# Patient Record
Sex: Female | Born: 1955 | Race: White | Hispanic: No | Marital: Married | Smoking: Former smoker
Health system: Southern US, Community
[De-identification: ages and names within clinical notes are randomized; demographics above are authoritative.]

## PROBLEM LIST (undated history)

## (undated) DIAGNOSIS — K219 Gastro-esophageal reflux disease without esophagitis: Secondary | ICD-10-CM

## (undated) DIAGNOSIS — F32A Depression, unspecified: Secondary | ICD-10-CM

## (undated) DIAGNOSIS — Z9221 Personal history of antineoplastic chemotherapy: Secondary | ICD-10-CM

## (undated) DIAGNOSIS — C801 Malignant (primary) neoplasm, unspecified: Secondary | ICD-10-CM

## (undated) DIAGNOSIS — Z923 Personal history of irradiation: Secondary | ICD-10-CM

## (undated) HISTORY — DX: Gastro-esophageal reflux disease without esophagitis: K21.9

## (undated) HISTORY — DX: Malignant (primary) neoplasm, unspecified: C80.1

## (undated) HISTORY — DX: Depression, unspecified: F32.A

## (undated) HISTORY — PX: BREAST SURGERY: SHX581

## (undated) HISTORY — PX: OTHER SURGICAL HISTORY: SHX169

## (undated) HISTORY — PX: BREAST LUMPECTOMY: SHX2

---

## 2002-05-11 ENCOUNTER — Other Ambulatory Visit: Admission: RE | Admit: 2002-05-11 | Discharge: 2002-05-11 | Payer: Self-pay | Admitting: Obstetrics and Gynecology

## 2004-02-17 ENCOUNTER — Ambulatory Visit: Payer: Self-pay | Admitting: Oncology

## 2004-02-21 ENCOUNTER — Ambulatory Visit: Payer: Self-pay | Admitting: Oncology

## 2004-03-02 ENCOUNTER — Ambulatory Visit: Payer: Self-pay | Admitting: Oncology

## 2004-07-31 LAB — HM COLONOSCOPY: HM Colonoscopy: NORMAL

## 2004-08-17 ENCOUNTER — Ambulatory Visit: Payer: Self-pay | Admitting: Oncology

## 2004-08-31 ENCOUNTER — Ambulatory Visit: Payer: Self-pay | Admitting: Oncology

## 2005-03-01 ENCOUNTER — Ambulatory Visit: Payer: Self-pay | Admitting: Oncology

## 2005-03-02 ENCOUNTER — Ambulatory Visit: Payer: Self-pay | Admitting: Oncology

## 2005-03-07 ENCOUNTER — Ambulatory Visit: Payer: Self-pay | Admitting: Oncology

## 2005-04-02 ENCOUNTER — Ambulatory Visit: Payer: Self-pay | Admitting: Oncology

## 2005-11-19 ENCOUNTER — Ambulatory Visit: Payer: Self-pay | Admitting: Oncology

## 2006-01-25 ENCOUNTER — Ambulatory Visit: Payer: Self-pay | Admitting: Oncology

## 2006-01-31 ENCOUNTER — Ambulatory Visit: Payer: Self-pay | Admitting: Oncology

## 2006-08-15 ENCOUNTER — Ambulatory Visit: Payer: Self-pay | Admitting: Oncology

## 2006-09-01 ENCOUNTER — Ambulatory Visit: Payer: Self-pay | Admitting: Oncology

## 2006-12-18 ENCOUNTER — Ambulatory Visit: Payer: Self-pay | Admitting: Oncology

## 2007-03-03 ENCOUNTER — Ambulatory Visit: Payer: Self-pay | Admitting: Internal Medicine

## 2007-03-21 ENCOUNTER — Ambulatory Visit: Payer: Self-pay | Admitting: Internal Medicine

## 2007-04-03 ENCOUNTER — Ambulatory Visit: Payer: Self-pay | Admitting: Internal Medicine

## 2008-02-01 ENCOUNTER — Ambulatory Visit: Payer: Self-pay | Admitting: Internal Medicine

## 2008-02-23 ENCOUNTER — Ambulatory Visit: Payer: Self-pay | Admitting: Oncology

## 2008-03-12 ENCOUNTER — Ambulatory Visit: Payer: Self-pay | Admitting: Specialist

## 2008-12-31 ENCOUNTER — Ambulatory Visit: Payer: Self-pay | Admitting: Internal Medicine

## 2009-01-28 ENCOUNTER — Ambulatory Visit: Payer: Self-pay | Admitting: Internal Medicine

## 2009-01-31 ENCOUNTER — Ambulatory Visit: Payer: Self-pay | Admitting: Internal Medicine

## 2010-04-11 ENCOUNTER — Ambulatory Visit: Payer: Self-pay

## 2011-04-17 ENCOUNTER — Ambulatory Visit: Payer: Self-pay | Admitting: Family Medicine

## 2012-07-31 ENCOUNTER — Encounter: Payer: Self-pay | Admitting: Internal Medicine

## 2012-07-31 ENCOUNTER — Ambulatory Visit (INDEPENDENT_AMBULATORY_CARE_PROVIDER_SITE_OTHER): Payer: No Typology Code available for payment source | Admitting: Internal Medicine

## 2012-07-31 VITALS — BP 118/76 | HR 70 | Temp 98.1°F | Resp 16 | Ht 68.0 in | Wt 153.8 lb

## 2012-07-31 DIAGNOSIS — Z720 Tobacco use: Secondary | ICD-10-CM

## 2012-07-31 DIAGNOSIS — Z853 Personal history of malignant neoplasm of breast: Secondary | ICD-10-CM

## 2012-07-31 DIAGNOSIS — R0609 Other forms of dyspnea: Secondary | ICD-10-CM

## 2012-07-31 DIAGNOSIS — E559 Vitamin D deficiency, unspecified: Secondary | ICD-10-CM

## 2012-07-31 DIAGNOSIS — F17200 Nicotine dependence, unspecified, uncomplicated: Secondary | ICD-10-CM

## 2012-07-31 DIAGNOSIS — R0600 Dyspnea, unspecified: Secondary | ICD-10-CM

## 2012-07-31 DIAGNOSIS — E785 Hyperlipidemia, unspecified: Secondary | ICD-10-CM

## 2012-07-31 DIAGNOSIS — R5381 Other malaise: Secondary | ICD-10-CM

## 2012-07-31 DIAGNOSIS — R5383 Other fatigue: Secondary | ICD-10-CM

## 2012-07-31 DIAGNOSIS — Z9889 Other specified postprocedural states: Secondary | ICD-10-CM

## 2012-07-31 LAB — CBC WITH DIFFERENTIAL/PLATELET
Basophils Absolute: 0 10*3/uL (ref 0.0–0.1)
Eosinophils Absolute: 0.1 10*3/uL (ref 0.0–0.7)
Hemoglobin: 14.8 g/dL (ref 12.0–15.0)
Lymphocytes Relative: 32.6 % (ref 12.0–46.0)
MCHC: 34.8 g/dL (ref 30.0–36.0)
Monocytes Relative: 8.3 % (ref 3.0–12.0)
Neutro Abs: 2.9 10*3/uL (ref 1.4–7.7)
Platelets: 214 10*3/uL (ref 150.0–400.0)
RDW: 13.1 % (ref 11.5–14.6)

## 2012-07-31 LAB — COMPREHENSIVE METABOLIC PANEL
Albumin: 4.3 g/dL (ref 3.5–5.2)
CO2: 25 mEq/L (ref 19–32)
Calcium: 9.4 mg/dL (ref 8.4–10.5)
Chloride: 104 mEq/L (ref 96–112)
GFR: 56.72 mL/min — ABNORMAL LOW (ref >60.00)
Glucose, Bld: 81 mg/dL (ref 70–99)
Sodium: 133 mEq/L — ABNORMAL LOW (ref 135–145)
Total Bilirubin: 1.1 mg/dL (ref 0.3–1.2)
Total Protein: 6.9 g/dL (ref 6.0–8.3)

## 2012-07-31 LAB — LDL CHOLESTEROL, DIRECT: Direct LDL: 146.9 mg/dL

## 2012-07-31 LAB — LIPID PANEL
HDL: 58 mg/dL (ref >39.00)
Triglycerides: 70 mg/dL (ref 0.0–149.0)

## 2012-07-31 LAB — TSH: TSH: 1.43 u[IU]/mL (ref 0.35–5.50)

## 2012-07-31 MED ORDER — VARENICLINE TARTRATE 0.5 MG X 11 & 1 MG X 42 PO MISC: ORAL | Status: DC

## 2012-07-31 MED ORDER — VARENICLINE TARTRATE 1 MG PO TABS: 1.0000 mg | ORAL_TABLET | Freq: Two times a day (BID) | ORAL | Status: DC

## 2012-07-31 NOTE — Progress Notes (Signed)
Patient ID: SAMMYE STAFF, female   DOB: [birth year], 57 y.o.   MRN: [id number]  Patient Active Problem List   Diagnosis Date Noted  . History of breast cancer in female 08/02/2012  . History of exploratory thoracotomy 08/02/2012  . Unspecified vitamin D deficiency 08/01/2012  . Tobacco abuse 08/01/2012    Subjective:  CC:   Chief Complaint  Patient presents with  . Establish Care    HPI:   KORRA CHRISTINE is a 57 y.o. 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  Looking forward to it .   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  Treated with chemo, XRT and lumpectomy  At [facility] treated by [facility] for onocology /XRT follow up.  Had Tamoxifen , then aromasin for  Intolerance.  Completed 5 yrs. Last mammogram was Spring 2013 at [facility] but worried they are not checkinn the area of concern. Lately has had a feeling of recurrent periodic fullness in breast.    3) TOBACCO ABUSE. She wants to quit smoking. Chantix has worked in the past. Was abstinent for a few years.  Resumed tobacco 2 years ago.   Running two miles daily but getting short of breath,  Used to do 7 months,      Past Medical History  Diagnosis Date  . Cancer     Past Surgical History  Procedure Laterality Date  . Breast surgery    . Thorecotomy N/A     Family History  Problem Relation Age of Onset  . Cancer Mother 39    colon CA  . Cancer Maternal Grandmother     breast ca  . Stroke Father     multiple PEs,  ca suspected  . Cancer Brother     NHL    History   Social History  . Marital Status: Divorced    Spouse Name: N/A   Number of Children: N/A  . Years of Education: N/A   Occupational History  . Not on file.   Social History Main Topics  . Smoking status: Current Every Day Smoker -- 1.00 packs/day    Types: Cigarettes    Start date: 08/01/2010  . Smokeless tobacco: Never Used     Comment: patient states ready to quit  . Alcohol Use: No     Comment: social rarely  . Drug Use: No  . Sexually Active: Not on file   Other Topics Concern  . Not on file   Social History Narrative  . No narrative on file    No Known Allergies   Review of Systems:   The remainder of the review of systems was negative except those addressed in the HPI.       Objective:  BP 118/76  Pulse 70  Temp(Src) 98.1 F (36.7 C) (Oral)  Resp 16  Ht 5\' 8"  (1.727 m)  Wt 153 lb 12 oz (69.741 kg)  BMI 23.38 kg/m2  SpO2 98%  General appearance: alert, cooperative and appears stated age Ears: normal TM's and external ear canals both ears Throat: lips,  mucosa, and tongue normal; teeth and gums normal Neck: no adenopathy, no carotid bruit, supple, symmetrical, trachea midline and thyroid not enlarged, symmetric, no tenderness/mass/nodules Breast: right lumpectomy , no masses,  Left breast normal. Back: symmetric, no curvature. ROM normal. No CVA tenderness. Lungs: clear to auscultation bilaterally Heart: regular rate and rhythm, S1, S2 normal, no murmur, click, rub or gallop Abdomen: soft, non-tender; bowel sounds normal; no masses,  no organomegaly Pulses: 2+ and symmetric Skin: Skin color, texture, turgor normal. No rashes or lesions Lymph nodes: Cervical, supraclavicular, and axillary nodes normal.  Assessment and Plan:  Unspecified vitamin D deficiency Drisdol weekly x 4, then 1000 units daily   Tobacco abuse Chantix prescribed.   History of breast cancer in female Diagnostic mammogram ordered.  Breast exam was negative for masses.   History of exploratory thoracotomy She has a history of tobacco abuse  and progressive dyspnea.  Chest x ray ordered.   Updated Medication List Outpatient Encounter Prescriptions as of 07/31/2012  Medication Sig Dispense Refill  . ergocalciferol (DRISDOL) 50000 UNITS capsule Take 1 capsule (50,000 Units total) by mouth once a week.  4 capsule  0  . varenicline (CHANTIX CONTINUING MONTH PAK) 1 MG tablet Take 1 tablet (1 mg total) by mouth 2 (two) times daily.  60 tablet  5  . varenicline (CHANTIX STARTING MONTH PAK) 0.5 MG X 11 & 1 MG X 42 tablet Take one 0.5 mg tablet by mouth once daily for 3 days, then increase to one 0.5 mg tablet twice daily for 4 days, then increase to one 1 mg tablet twice daily.  53 tablet  0   No facility-administered encounter medications on file as of 07/31/2012.     Orders Placed This Encounter  Procedures  . HM MAMMOGRAPHY  . MM Digital Diagnostic Bilat  . DG Chest 2 View  . CBC with Differential  . Comprehensive metabolic panel  . TSH  . Lipid panel  . Vitamin D 25 hydroxy  . HM PAP SMEAR  . LDL cholesterol, direct  . HM COLONOSCOPY    No Follow-up on file.

## 2012-07-31 NOTE — Patient Instructions (Addendum)
1200 mg calcium and 800 to 1000 units Vit D  (Unless you are deficient)  Try to get most of calcium via diet.   Chest xraay and diagnostic mammogram both sides at [facility] breast   Sign up for mychart and I will send you your labs via it.   Fasting labs today

## 2012-08-01 DIAGNOSIS — Z720 Tobacco use: Secondary | ICD-10-CM | POA: Insufficient documentation

## 2012-08-01 DIAGNOSIS — E559 Vitamin D deficiency, unspecified: Secondary | ICD-10-CM | POA: Insufficient documentation

## 2012-08-01 LAB — VITAMIN D 25 HYDROXY (VIT D DEFICIENCY, FRACTURES): Vit D, 25-Hydroxy: 25 ng/mL — ABNORMAL LOW (ref 30–89)

## 2012-08-01 MED ORDER — ERGOCALCIFEROL 1.25 MG (50000 UT) PO CAPS: 50000.0000 [IU] | ORAL_CAPSULE | ORAL | Status: DC

## 2012-08-01 NOTE — Assessment & Plan Note (Signed)
Drisdol weekly x 4, then 1000 units daily

## 2012-08-02 ENCOUNTER — Encounter: Payer: Self-pay | Admitting: Internal Medicine

## 2012-08-02 DIAGNOSIS — Z9889 Other specified postprocedural states: Secondary | ICD-10-CM | POA: Insufficient documentation

## 2012-08-02 DIAGNOSIS — Z853 Personal history of malignant neoplasm of breast: Secondary | ICD-10-CM | POA: Insufficient documentation

## 2012-08-02 NOTE — Assessment & Plan Note (Signed)
She has a history of tobacco abuse and progressive dyspnea.  Chest x ray ordered.

## 2012-08-02 NOTE — Assessment & Plan Note (Signed)
Chantix prescribed.

## 2012-08-02 NOTE — Assessment & Plan Note (Signed)
Diagnostic mammogram ordered.  Breast exam was negative for masses.

## 2012-10-15 ENCOUNTER — Encounter: Payer: Self-pay | Admitting: Internal Medicine

## 2013-04-03 ENCOUNTER — Ambulatory Visit (INDEPENDENT_AMBULATORY_CARE_PROVIDER_SITE_OTHER): Payer: No Typology Code available for payment source | Admitting: Adult Health

## 2013-04-03 ENCOUNTER — Encounter: Payer: Self-pay | Admitting: Adult Health

## 2013-04-03 VITALS — BP 110/62 | HR 68 | Temp 98.0°F | Resp 12 | Wt 151.0 lb

## 2013-04-03 DIAGNOSIS — J019 Acute sinusitis, unspecified: Secondary | ICD-10-CM | POA: Insufficient documentation

## 2013-04-03 MED ORDER — AMOXICILLIN-POT CLAVULANATE 875-125 MG PO TABS: 1.0000 | ORAL_TABLET | Freq: Two times a day (BID) | ORAL | Status: DC

## 2013-04-03 MED ORDER — ALBUTEROL SULFATE HFA 108 (90 BASE) MCG/ACT IN AERS: 2.0000 | INHALATION_SPRAY | Freq: Four times a day (QID) | RESPIRATORY_TRACT | Status: DC | PRN

## 2013-04-03 MED ORDER — GUAIFENESIN-CODEINE 100-10 MG/5ML PO SOLN: 5.0000 mL | Freq: Three times a day (TID) | ORAL | Status: DC | PRN

## 2013-04-03 NOTE — Progress Notes (Signed)
Pre visit review using our clinic review tool, if applicable. No additional management support is needed unless otherwise documented below in the visit note. 

## 2013-04-03 NOTE — Patient Instructions (Signed)
  Start Augmentin twice daily x 10 days.  Robitussin AC for severe cough. This will cause sedation. Do not drive when taking this medication.  If no improvement in 4-5 days please call.  ProAir Inhaler 2 puffs into the lungs every 6 hours as needed for wheezing, shortness of breath.

## 2013-04-03 NOTE — Progress Notes (Signed)
   Subjective:    Patient ID: Sandy Hart, female    DOB: March 27, 1956, 58 y.o.   MRN: [id number]  HPI  Rockie presents to clinic with upper respiratory symptoms of congestion, sinus pressure, post nasal drip ongoing x 3 weeks.  Review of Systems  Constitutional: Positive for chills and fatigue. Negative for fever.  HENT: Positive for congestion, postnasal drip, rhinorrhea, sinus pressure and sneezing. Negative for sore throat.   Respiratory: Positive for cough and wheezing.    BP 110/62  Pulse 68  Temp(Src) 98 F (36.7 C) (Oral)  Resp 12  Wt 151 lb (68.493 kg)  SpO2 97%     Objective:   Physical Exam  Constitutional: She is oriented to person, place, and time. She appears well-developed and well-nourished.  Appears acutely ill  HENT:  Pharyngeal erythema. No exudate. Drainage.  Cardiovascular: Normal rate, regular rhythm, normal heart sounds and intact distal pulses.  Exam reveals no gallop and no friction rub.   No murmur heard. Pulmonary/Chest: Effort normal and breath sounds normal. No respiratory distress. She has no wheezes. She has no rales.  Musculoskeletal: Normal range of motion.  Neurological: She is alert and oriented to person, place, and time.          Assessment & Plan:

## 2013-04-03 NOTE — Assessment & Plan Note (Signed)
Start Augmentin x 10 days. Robitussin AC. RTC if no improvement within 4-5 days.

## 2013-10-19 ENCOUNTER — Telehealth: Payer: Self-pay | Admitting: *Deleted

## 2013-10-19 NOTE — Telephone Encounter (Signed)
Pt called states while she was out of town she injured her ankle.  She requests an appointment with Dr Derrel Nip for follow up.  Pt was advised we did not have access to read the radiology disc, however she could be seen on 7.21.15.  Pt information given to Lorriane Shire to schedule 7.21.15 at 2:45pm

## 2013-10-20 ENCOUNTER — Ambulatory Visit (INDEPENDENT_AMBULATORY_CARE_PROVIDER_SITE_OTHER): Payer: No Typology Code available for payment source | Admitting: Internal Medicine

## 2013-10-20 ENCOUNTER — Ambulatory Visit (INDEPENDENT_AMBULATORY_CARE_PROVIDER_SITE_OTHER)
Admission: RE | Admit: 2013-10-20 | Discharge: 2013-10-20 | Disposition: A | Discharge status: Home / Self Care | Payer: No Typology Code available for payment source | Source: Ambulatory Visit | Attending: Internal Medicine | Admitting: Internal Medicine

## 2013-10-20 ENCOUNTER — Encounter: Payer: Self-pay | Admitting: Internal Medicine

## 2013-10-20 VITALS — BP 110/66 | HR 59 | Temp 98.0°F | Resp 16 | Ht 68.0 in | Wt 145.0 lb

## 2013-10-20 DIAGNOSIS — M25579 Pain in unspecified ankle and joints of unspecified foot: Secondary | ICD-10-CM

## 2013-10-20 DIAGNOSIS — M25571 Pain in right ankle and joints of right foot: Secondary | ICD-10-CM

## 2013-10-20 MED ORDER — HYDROCODONE-ACETAMINOPHEN 10-325 MG PO TABS: 1.0000 | ORAL_TABLET | Freq: Three times a day (TID) | ORAL | Status: DC | PRN

## 2013-10-20 MED ORDER — IBUPROFEN 800 MG PO TABS: 800.0000 mg | ORAL_TABLET | Freq: Three times a day (TID) | ORAL | Status: DC | PRN

## 2013-10-20 NOTE — Progress Notes (Signed)
Pre-visit discussion using our clinic review tool. No additional management support is needed unless otherwise documented below in the visit note.  

## 2013-10-20 NOTE — Progress Notes (Signed)
Patient ID: Sandy Hart, female   DOB: Dec 27, 1955, 58 y.o.   MRN: [id number]  Patient Active Problem List   Diagnosis Date Noted  . Pain in joint, ankle and foot 10/21/2013  . Acute sinusitis with symptoms > 10 days 04/03/2013  . History of breast cancer in female 08/02/2012  . History of exploratory thoracotomy 08/02/2012  . Unspecified vitamin D deficiency 08/01/2012  . Tobacco abuse 08/01/2012    Subjective:  CC:   Chief Complaint  Patient presents with  . Ankle Injury    sky diving accident hit ground with right foot.    HPI:   Sandy Hart is a 58 y.o. female who presents for  Right ankle pain and swelling,  Patient sustained an injury last Friday on her first sky diving adventure  Which took place on [place].  Tandem jump, landed on her right foot and fell over.  Felt   iimmediate pain and swelling, which has now localized to the right side. She was evaluated at the [facility] ER  Friday evening and the radiologist felt she had a lateral malleolar fracture but the ER doc disagreed and thought the bones were too obscured by swelling.  She iced it for a few days  And has been using motrin and percocet but left her percocet at the beach .  Using crutches,  Not driving.  Selling is worse by the end of the day    Past Medical History  Diagnosis Date  . Cancer     Past Surgical History  Procedure Laterality Date  . Breast surgery    . Thorecotomy N/A        The following portions of the patient's history were reviewed and updated as appropriate: Allergies, current medications, and problem list.    Review of Systems:   Patient denies headache, fevers, malaise, unintentional weight loss, skin rash, eye pain, sinus congestion and sinus pain, sore throat, dysphagia,  hemoptysis , cough, dyspnea, wheezing, chest pain, palpitations, orthopnea, edema, abdominal pain, nausea, melena, diarrhea, constipation, flank pain, dysuria, hematuria, urinary  Frequency,  nocturia, numbness, tingling, seizures,  Focal weakness, Loss of consciousness,  Tremor, insomnia, depression, anxiety, and suicidal ideation.     History   Social History  . Marital Status: Divorced    Spouse Name: N/A    Number of Children: N/A  . Years of Education: N/A   Occupational History  . Not on file.   Social History Main Topics  . Smoking status: Former Smoker -- 1.00 packs/day    Types: Cigarettes    Start date: 08/01/2010    Quit date: 07/31/2012  . Smokeless tobacco: Never Used     Comment: patient states ready to quit  . Alcohol Use: No     Comment: social rarely  . Drug Use: No  . Sexual Activity: Not on file   Other Topics Concern  . Not on file   Social History Narrative  . No narrative on file    Objective:  Filed Vitals:   10/20/13 1452  BP: 110/66  Pulse: 59  Temp: 98 F (36.7 C)  Resp: 16     General appearance: alert, cooperative and appears stated age Ears: normal TM's and external ear canals both ears Throat: lips, mucosa, and tongue normal; teeth and gums normal Neck: no adenopathy, no carotid bruit, supple, symmetrical, trachea midline and thyroid not enlarged, symmetric, no tenderness/mass/nodules Back: symmetric, no curvature. ROM normal. No CVA tenderness. Lungs: clear to auscultation bilaterally  Heart: regular rate and rhythm, S1, S2 normal, no murmur, click, rub or gallop Abdomen: soft, non-tender; bowel sounds normal; no masses,  no organomegaly Pulses: 2+ and symmetric MSK: right lateral ankle swollen bruised appearing  ROM limited by pain  Lymph nodes: Cervical, supraclavicular, and axillary nodes normal.  Assessment and Plan:  Pain in joint, ankle and foot Secondary to a nondisplaced fibular fracture from traumatic landing during skydiving.  Placed in air cast.  orth referral pending.   Updated Medication List Outpatient Encounter Prescriptions as of 10/20/2013  Medication Sig  . albuterol (PROVENTIL HFA;VENTOLIN  HFA) 108 (90 BASE) MCG/ACT inhaler Inhale 2 puffs into the lungs every 6 (six) hours as needed for wheezing or shortness of breath.  Marland Kitchen amoxicillin-clavulanate (AUGMENTIN) 875-125 MG per tablet Take 1 tablet by mouth 2 (two) times daily.  Marland Kitchen guaiFENesin-codeine 100-10 MG/5ML syrup Take 5 mLs by mouth 3 (three) times daily as needed.  Marland Kitchen HYDROcodone-acetaminophen (NORCO) 10-325 MG per tablet Take 1 tablet by mouth every 8 (eight) hours as needed.  Marland Kitchen ibuprofen (ADVIL,MOTRIN) 800 MG tablet Take 1 tablet (800 mg total) by mouth every 8 (eight) hours as needed.  Marland Kitchen oxyCODONE-acetaminophen (PERCOCET/ROXICET) 5-325 MG per tablet Take 1 tablet by mouth 4 (four) times daily as needed.     Orders Placed This Encounter  Procedures  . DG Ankle Complete Right  . Ambulatory referral to Orthopedic Surgery    No Follow-up on file.

## 2013-10-20 NOTE — Patient Instructions (Addendum)
Continue motrin 800 mg every 8 hours and elevation for the swelling.  vicodin as needed for pain   Continue applying ice for 15 minutes every few hours  Referral to [place] the ankle air cast for stability when walking  And get your x rays tomorrow morning when your leg is least swollenb  You have an appt with [facility] on Monday 10:30 am with  Dr Jhonnie Garner [facility] office

## 2013-10-21 ENCOUNTER — Emergency Department: Payer: Self-pay | Admitting: Internal Medicine

## 2013-10-21 DIAGNOSIS — M25579 Pain in unspecified ankle and joints of unspecified foot: Secondary | ICD-10-CM | POA: Insufficient documentation

## 2013-10-21 NOTE — Assessment & Plan Note (Signed)
Secondary to a nondisplaced fibular fracture from traumatic landing during skydiving.  Placed in air cast.  orth referral pending.

## 2013-10-26 ENCOUNTER — Ambulatory Visit (INDEPENDENT_AMBULATORY_CARE_PROVIDER_SITE_OTHER): Payer: No Typology Code available for payment source | Admitting: Family Medicine

## 2013-10-26 ENCOUNTER — Encounter: Payer: Self-pay | Admitting: Family Medicine

## 2013-10-26 VITALS — BP 110/62 | HR 57 | Temp 98.2°F | Ht 68.0 in

## 2013-10-26 DIAGNOSIS — S82899A Other fracture of unspecified lower leg, initial encounter for closed fracture: Secondary | ICD-10-CM

## 2013-10-26 DIAGNOSIS — S82831A Other fracture of upper and lower end of right fibula, initial encounter for closed fracture: Secondary | ICD-10-CM | POA: Insufficient documentation

## 2013-10-26 NOTE — Progress Notes (Signed)
Sandy Hart [id number] Phone: [phone] Fax: [phone]  Patient ID: Sandy Hart MRN: [id number], DOB: 05/25/1955, 58 y.o. Date of Encounter: 10/26/2013  Primary Physician:  Deborra Medina, MD   Chief Complaint: Sandy Hart   Subjective:   Dear Dr. Derrel Nip,  Thank you for having me see Sandy Hart in consultation today at [facility] at [facility] for her problem with RIGHT distal fibular fracture.  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 She felt some pain immediately and had lateral swelling, as well. There was a question of fibular fracture at the [facility] ER. Since then, she has been non-weightbearing.   DOI 10/16/2013.  10/20/2013, Sandy Hart went to your office and on X-ray was found to have a non-displaced right fibular fracture. She ultimately went to the ER and has been stabilized in a combined fiberglass posterior splint / stirrup and has remained non-weightbearing.   Today, she has diminished pain, some continued bruising, and decreased swelling. Overall, she feels fairly good.  Radiology: Dg Ankle Complete Right  10/20/2013   CLINICAL DATA:  Injured sky diving with lateral ankle pain  EXAM: RIGHT ANKLE - COMPLETE 3+ VIEW  COMPARISON:  None.  FINDINGS: There is a linear lucency through the distal right fibula which may well represent a nondisplaced fracture but no obvious cortical disruption is seen. The ankle joint appears normal and alignment is normal.  IMPRESSION: Suspect nondisplaced linear fracture of the distal right fibula.   Electronically Signed   By: Ivar Drape M.D.   On: 10/20/2013 15:58    The radiological images were independently reviewed by myself in the office and results were reviewed with the patient. My independent interpretation of images:  We reviewed these images face-to-face. On  multiple views there is an area of linear lucency that would correspond to the patient's area of maximal pain which would correspond to a nondisplaced distal fibular fracture. The mortise of the ankle appears to be intact. Electronically Signed  By: Owens Loffler, MD On: 10/26/2013 1:54 PM   Past Medical History  Diagnosis Date  . Cancer     Past Surgical History  Procedure Laterality Date  . Breast surgery    . Thorecotomy N/A     History   Social History  . Marital Status: Married    Spouse Name: N/A    Number of Children: N/A  . Years of Education: N/A   Social History Main Topics  . Smoking status: Former Smoker -- 1.00 packs/day    Types: Cigarettes    Start date: 08/01/2010    Quit date: 07/31/2012  . Smokeless tobacco: Never Used     Comment: patient states ready to quit  . Alcohol Use: No     Comment: social rarely  . Drug Use: No  . Sexual Activity: None   Other Topics Concern  . None   Social History Narrative  . None    Family History  Problem Relation Age of Onset  . Cancer Mother 96    colon CA  . Cancer Maternal Grandmother     breast ca  . Stroke Father     multiple PEs,  ca suspected  . Cancer Brother     NHL    Medications and Allergies reviewed  Review of Systems:    GEN: No  fevers, chills. Nontoxic. Primarily MSK c/o today. MSK: Detailed in the HPI GI: tolerating PO intake without difficulty Neuro: No numbness, parasthesias, or tingling associated. Otherwise, the pertinent positives and negatives are listed above and in the HPI, otherwise a full review of systems has been reviewed and is negative unless noted positive.   Objective:   Physical Examination: BP 110/62  Pulse 57  Temp(Src) 98.2 F (36.8 C) (Oral)  Ht 5\' 8"  (1.727 m)    GEN: WDWN, NAD, Non-toxic, Alert & Oriented x 3 HEENT: Atraumatic, Normocephalic.  Ears and Nose: No external deformity. PSYCH: Normally interactive. Conversant. Not depressed or anxious  appearing.  Calm demeanor.    The patient is nontender throughout all of her toes. She is nontender throughout the entirety of the forefoot. The lateral aspect of the foot and ankle is significantly bruised. There is relatively diffuse swelling throughout the lower extremity about the foot and ankle.  She is nontender in the TALUS She is nontender at the fifth metatarsal. She is nontender at the ATF L., CFL, or deltoid ligaments. She is nontender at the medial malleolus. She is tender at the lateral malleolus and at the distal fibula with minimal palpation. Aggressive palpation is not undertaken given known fracture.  Assessment & Plan:   Fracture of fibula, distal, right, closed   Recommendations: 1. Anticipate at least 6 weeks of immobilization. 6-8 weeks is typical. 2. Placed in a pneumatic Aircast fracture boot. Patient placed in a pneumatic compression-type CAM, which has been shown to decrease overall time of rehab, swelling, and faster return to full function.  3. Partial weight-bearing, advance as tolerated.  4. Out of work until reassessed.  5. Typically, these injuries would be reassessed every 7-10 days to assess stability. Right now, the patient is in perfect anatomical alignment. She is leaving for [place] in 2 days and will be gone for 3 weeks. I discussed potential risk and potential morbidity with her. If her ankle displaces while in [place], she risks the integrity of her ankle and typically with mortise disruption, this injury would be converted to operative management. There is additional risk without getting recommended routine fracture follow-up. The patient understands all of these risks, and I did my best to go over in detail them with her.  We will see the patient back in [place] for f/u Monday, 11/16/2013..   Thank you for having [place] see Sandy Hart in consultation.  This is a very pleasant patient, and I enjoyed meeting her today.   Signed,  Maud Deed. Jules Baty,  MD, [facility]   Discontinued Medications   ALBUTEROL (PROVENTIL HFA;VENTOLIN HFA) 108 (90 BASE) MCG/ACT INHALER    Inhale 2 puffs into the lungs every 6 (six) hours as needed for wheezing or shortness of breath.   AMOXICILLIN-CLAVULANATE (AUGMENTIN) 875-125 MG PER TABLET    Take 1 tablet by mouth 2 (two) times daily.   GUAIFENESIN-CODEINE 100-10 MG/5ML SYRUP    Take 5 mLs by mouth 3 (three) times daily as needed.   OXYCODONE-ACETAMINOPHEN (PERCOCET/ROXICET) 5-325 MG PER TABLET    Take 1 tablet by mouth 4 (four) times daily as needed.   Current Medications at Discharge:   Medication List       This list is accurate as of: 10/26/13  1:41 PM.  Always use your most recent med list.               HYDROcodone-acetaminophen 10-325 MG per tablet  Commonly known as:  NORCO  Take 1 tablet by mouth every 8 (eight) hours as needed.     ibuprofen 800 MG tablet  Commonly known as:  ADVIL,MOTRIN  Take 1 tablet (800 mg total) by mouth every 8 (eight) hours as needed.

## 2013-10-26 NOTE — Progress Notes (Signed)
Pre visit review using our clinic review tool, if applicable. No additional management support is needed unless otherwise documented below in the visit note. 

## 2013-11-16 ENCOUNTER — Encounter: Payer: Self-pay | Admitting: Family Medicine

## 2013-11-16 ENCOUNTER — Ambulatory Visit (INDEPENDENT_AMBULATORY_CARE_PROVIDER_SITE_OTHER)
Admission: RE | Admit: 2013-11-16 | Discharge: 2013-11-16 | Disposition: A | Discharge status: Home / Self Care | Payer: No Typology Code available for payment source | Source: Ambulatory Visit | Attending: Family Medicine | Admitting: Family Medicine

## 2013-11-16 ENCOUNTER — Ambulatory Visit (INDEPENDENT_AMBULATORY_CARE_PROVIDER_SITE_OTHER): Payer: No Typology Code available for payment source | Admitting: Family Medicine

## 2013-11-16 VITALS — BP 116/66 | HR 62 | Temp 98.4°F | Ht 68.0 in | Wt 140.8 lb

## 2013-11-16 DIAGNOSIS — S82899A Other fracture of unspecified lower leg, initial encounter for closed fracture: Secondary | ICD-10-CM

## 2013-11-16 DIAGNOSIS — S82831A Other fracture of upper and lower end of right fibula, initial encounter for closed fracture: Secondary | ICD-10-CM

## 2013-11-16 NOTE — Progress Notes (Signed)
[place] [address] Phone: [phone] Fax: [phone]  Patient ID: ISAAC LACSON MRN: [id number], DOB: 01/17/1956, 58 y.o. Date of Encounter: 11/16/2013  Primary Physician:  Deborra Medina, MD   Chief Complaint: Follow-up   Subjective:   The patient is here in followup for right distal fibular fracture. Intervally, the patient went to [place], she actually did quite well. She was able to wear her fracture boot while she was there. Now she is able to be fully ambulatory and walk without any difficulty. Her pain is much less and she is not really having a superficial swelling.  10/26/2013 Last OV with Owens Loffler, MD  Thank you for having me see [name] in consultation today at [facility] at [facility] for her problem with RIGHT distal fibular fracture.  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 She felt some pain immediately and had lateral swelling, as well. There was a question of fibular fracture at the [facility] ER. Since then, she has been non-weightbearing.   DOI 10/16/2013.  10/20/2013, Ms. Dreese went to your office and on X-ray was found to have a non-displaced right fibular fracture. She ultimately went to the ER and has been stabilized in a combined fiberglass posterior splint / stirrup and has remained non-weightbearing.   Today, she has diminished pain, some continued bruising, and decreased swelling. Overall, she feels fairly good.  The PMH, PSH, Social History, Family History, Medications, and allergies have been reviewed in [facility], and have been updated if relevant.   Review of Systems:    GEN: No fevers, chills. Nontoxic. Primarily MSK c/o today. MSK: Detailed in the HPI GI: tolerating PO intake without difficulty Neuro: No numbness, parasthesias, or tingling associated. Otherwise, the pertinent  positives and negatives are listed above and in the HPI, otherwise a full review of systems has been reviewed and is negative unless noted positive.   Objective:   Physical Examination: BP 116/66  Pulse 62  Temp(Src) 98.4 F (36.9 C) (Oral)  Ht 5\' 8"  (1.727 m)  Wt 140 lb 12 oz (63.844 kg)  BMI 21.41 kg/m2    GEN: WDWN, NAD, Non-toxic, Alert & Oriented x 3 HEENT: Atraumatic, Normocephalic.  Ears and Nose: No external deformity. PSYCH: Normally interactive. Conversant. Not depressed or anxious appearing.  Calm demeanor.    The patient is nontender throughout all of her toes. She is nontender throughout the entirety of the forefoot. Minimal bruising at this point. There is no significant swelling. The patient is minimally tan there on the lateral fibula. Otherwise the foot and ankle exam is completely normal.  Dg Ankle Complete Right  11/16/2013   CLINICAL DATA:  Followup fibular fracture.  EXAM: RIGHT ANKLE - COMPLETE 3+ VIEW  COMPARISON:  10/20/2013  FINDINGS: Fibular fracture is still evident on the oblique view but not evident on the lateral view. This would suggest interval partial healing. There is no fracture displacement or angulation.  No other fracture. Ankle mortise is normally spaced and aligned. There is mild soft tissue swelling, decreased from the prior exam.  IMPRESSION: Fibular fracture is only subtly defined on the oblique view, not evident on the lateral view. There has been partial interval healing. No new abnormalities.   Electronically Signed   By: Lajean Manes M.D.   On: 11/16/2013 12:53  The radiological images were independently reviewed by myself in the office and results were reviewed with the patient. My independent interpretation of images:  I agree that there does appear to be interval fracture healing. The fracture line itself is more vague in appearance. The ankle mortise is intact. This correlates clinically. No dislocation. Electronically Signed  By: Owens Loffler, MD On: 11/16/2013 1:58 PM   Dg Ankle Complete Right  10/20/2013   CLINICAL DATA:  Injured sky diving with lateral ankle pain  EXAM: RIGHT ANKLE - COMPLETE 3+ VIEW  COMPARISON:  None.  FINDINGS: There is a linear lucency through the distal right fibula which may well represent a nondisplaced fracture but no obvious cortical disruption is seen. The ankle joint appears normal and alignment is normal.  IMPRESSION: Suspect nondisplaced linear fracture of the distal right fibula.   Electronically Signed   By: Ivar Drape M.D.   On: 10/20/2013 15:58    Assessment & Plan:   Fracture of fibula, distal, right, closed - Plan: DG Ankle Complete Right   Recommendations: 1. Anticipate at least 6 weeks of immobilization. 6-8 weeks is typical. 2. Placed in a pneumatic Aircast fracture boot. Patient placed in a pneumatic compression-type CAM, which has been shown to decrease overall time of rehab, swelling, and faster return to full function.  3. Full WB 4. Return to work with one restriction, being no lifting greater than 30 pounds.  We will see the patient back in Return in about 2 weeks (around 11/30/2013)..   I appreciate the opportunity to evaluate this very friendly patient. If you have any question regarding her care or prognosis, do not hesitate to ask.    Signed,  Maud Deed. Seger Jani, MD, [place]

## 2013-11-16 NOTE — Progress Notes (Signed)
Pre visit review using our clinic review tool, if applicable. No additional management support is needed unless otherwise documented below in the visit note. 

## 2013-12-02 ENCOUNTER — Ambulatory Visit (INDEPENDENT_AMBULATORY_CARE_PROVIDER_SITE_OTHER): Payer: No Typology Code available for payment source | Admitting: Family Medicine

## 2013-12-02 ENCOUNTER — Ambulatory Visit (INDEPENDENT_AMBULATORY_CARE_PROVIDER_SITE_OTHER)
Admission: RE | Admit: 2013-12-02 | Discharge: 2013-12-02 | Disposition: A | Discharge status: Home / Self Care | Payer: No Typology Code available for payment source | Source: Ambulatory Visit | Attending: Family Medicine | Admitting: Family Medicine

## 2013-12-02 ENCOUNTER — Encounter: Payer: Self-pay | Admitting: Family Medicine

## 2013-12-02 VITALS — BP 98/60 | HR 48 | Temp 98.1°F | Ht 68.0 in | Wt 144.2 lb

## 2013-12-02 DIAGNOSIS — S82899A Other fracture of unspecified lower leg, initial encounter for closed fracture: Secondary | ICD-10-CM

## 2013-12-02 DIAGNOSIS — S82831A Other fracture of upper and lower end of right fibula, initial encounter for closed fracture: Secondary | ICD-10-CM

## 2013-12-02 NOTE — Progress Notes (Signed)
Pre visit review using our clinic review tool, if applicable. No additional management support is needed unless otherwise documented below in the visit note. 

## 2013-12-02 NOTE — Progress Notes (Signed)
[place] [address] Phone: [phone] Fax: [phone]  Patient ID: Sandy Hart MRN: [id number], DOB: 08-22-1955, 58 y.o. Date of Encounter: 12/02/2013  Primary Physician:  Sandy Medina, MD   Chief Complaint: Follow-up   Subjective:   F/u distal fibular fracture. The patient has done very well. CAM fracture boot, compliant, and now with much decreased pain.  11/16/2013 Last OV with Sandy Loffler, MD  The patient is here in followup for right distal fibular fracture. Intervally, the patient went to [place], she actually did quite well. She was able to wear her fracture boot while she was there. Now she is able to be fully ambulatory and walk without any difficulty. Her pain is much less and she is not really having a superficial swelling.  10/26/2013 Last OV with Sandy Loffler, MD  Thank you for having me see Sandy Hart in consultation today at [facility] at [facility] for her problem with RIGHT distal fibular fracture.  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 She felt some pain immediately and had lateral swelling, as well. There was a question of fibular fracture at the [facility] ER. Since then, she has been non-weightbearing.   DOI 10/16/2013.  10/20/2013, Ms. Sandy Hart went to your office and on X-ray was found to have a non-displaced right fibular fracture. She ultimately went to the ER and has been stabilized in a combined fiberglass posterior splint / stirrup and has remained non-weightbearing.   Today, she has diminished pain, some continued bruising, and decreased swelling. Overall, she feels fairly good.  The PMH, PSH, Social History, Family History, Medications, and allergies have been reviewed in [facility], and have been updated if relevant.   Review of Systems:    GEN: No fevers, chills. Nontoxic. Primarily  MSK c/o today. MSK: Detailed in the HPI GI: tolerating PO intake without difficulty Neuro: No numbness, parasthesias, or tingling associated. Otherwise, the pertinent positives and negatives are listed above and in the HPI, otherwise a full review of systems has been reviewed and is negative unless noted positive.   Objective:   Physical Examination: BP 98/60  Pulse 48  Temp(Src) 98.1 F (36.7 C) (Oral)  Ht 5\' 8"  (1.727 m)  Wt 144 lb 4 oz (65.431 kg)  BMI 21.94 kg/m2  SpO2 99%    GEN: WDWN, NAD, Non-toxic, Alert & Oriented x 3 HEENT: Atraumatic, Normocephalic.  Ears and Nose: No external deformity. PSYCH: Normally interactive. Conversant. Not depressed or anxious appearing.  Calm demeanor.    The patient is nontender throughout all of her toes. She is nontender throughout the entirety of the forefoot. no bruising at this point. There is no significant swelling. The patient is NT on lateral fibula. Otherwise the foot and ankle exam is completely normal.  Dg Ankle Complete Right  11/16/2013   CLINICAL DATA:  Followup fibular fracture.  EXAM: RIGHT ANKLE - COMPLETE 3+ VIEW  COMPARISON:  10/20/2013  FINDINGS: Fibular fracture is still evident on the oblique view but not evident on the lateral view. This would suggest interval partial healing. There is no fracture displacement or angulation.  No other fracture. Ankle mortise is normally spaced and aligned. There is mild soft tissue swelling, decreased from the prior exam.  IMPRESSION: Fibular fracture is only subtly defined on the oblique view, not evident on the  lateral view. There has been partial interval healing. No new abnormalities.   Electronically Signed   By: Sandy Hart M.D.   On: 11/16/2013 12:53   Dg Ankle Complete Right  10/20/2013   CLINICAL DATA:  Injured sky diving with lateral ankle pain  EXAM: RIGHT ANKLE - COMPLETE 3+ VIEW  COMPARISON:  None.  FINDINGS: There is a linear lucency through the distal right fibula which may  well represent a nondisplaced fracture but no obvious cortical disruption is seen. The ankle joint appears normal and alignment is normal.  IMPRESSION: Suspect nondisplaced linear fracture of the distal right fibula.   Electronically Signed   By: Sandy Hart M.D.   On: 10/20/2013 15:58    Assessment & Plan:   Fracture of fibula, distal, right, closed - Plan: DG Ankle Complete Right, Ambulatory referral to Physical Therapy   Recommendations: Wean out of CAM this week Home rehab reviewed. Formal PT  F/u prn. I think she will do really well.  Signed,  Sandy Hart. Sandy Bachmann, MD, [place]

## 2014-03-23 ENCOUNTER — Encounter: Payer: Self-pay | Admitting: *Deleted

## 2014-04-06 NOTE — Telephone Encounter (Signed)
Mailed unread message to pt  

## 2015-01-29 IMAGING — CR DG ANKLE COMPLETE 3+V*R*
2 series · 2 of 2 positions shown · non-contrast
Comparison: None.

CLINICAL DATA: Injured sky diving with lateral ankle pain

EXAM:
RIGHT ANKLE - COMPLETE 3+ VIEW

[view not recorded (1 of 2)]
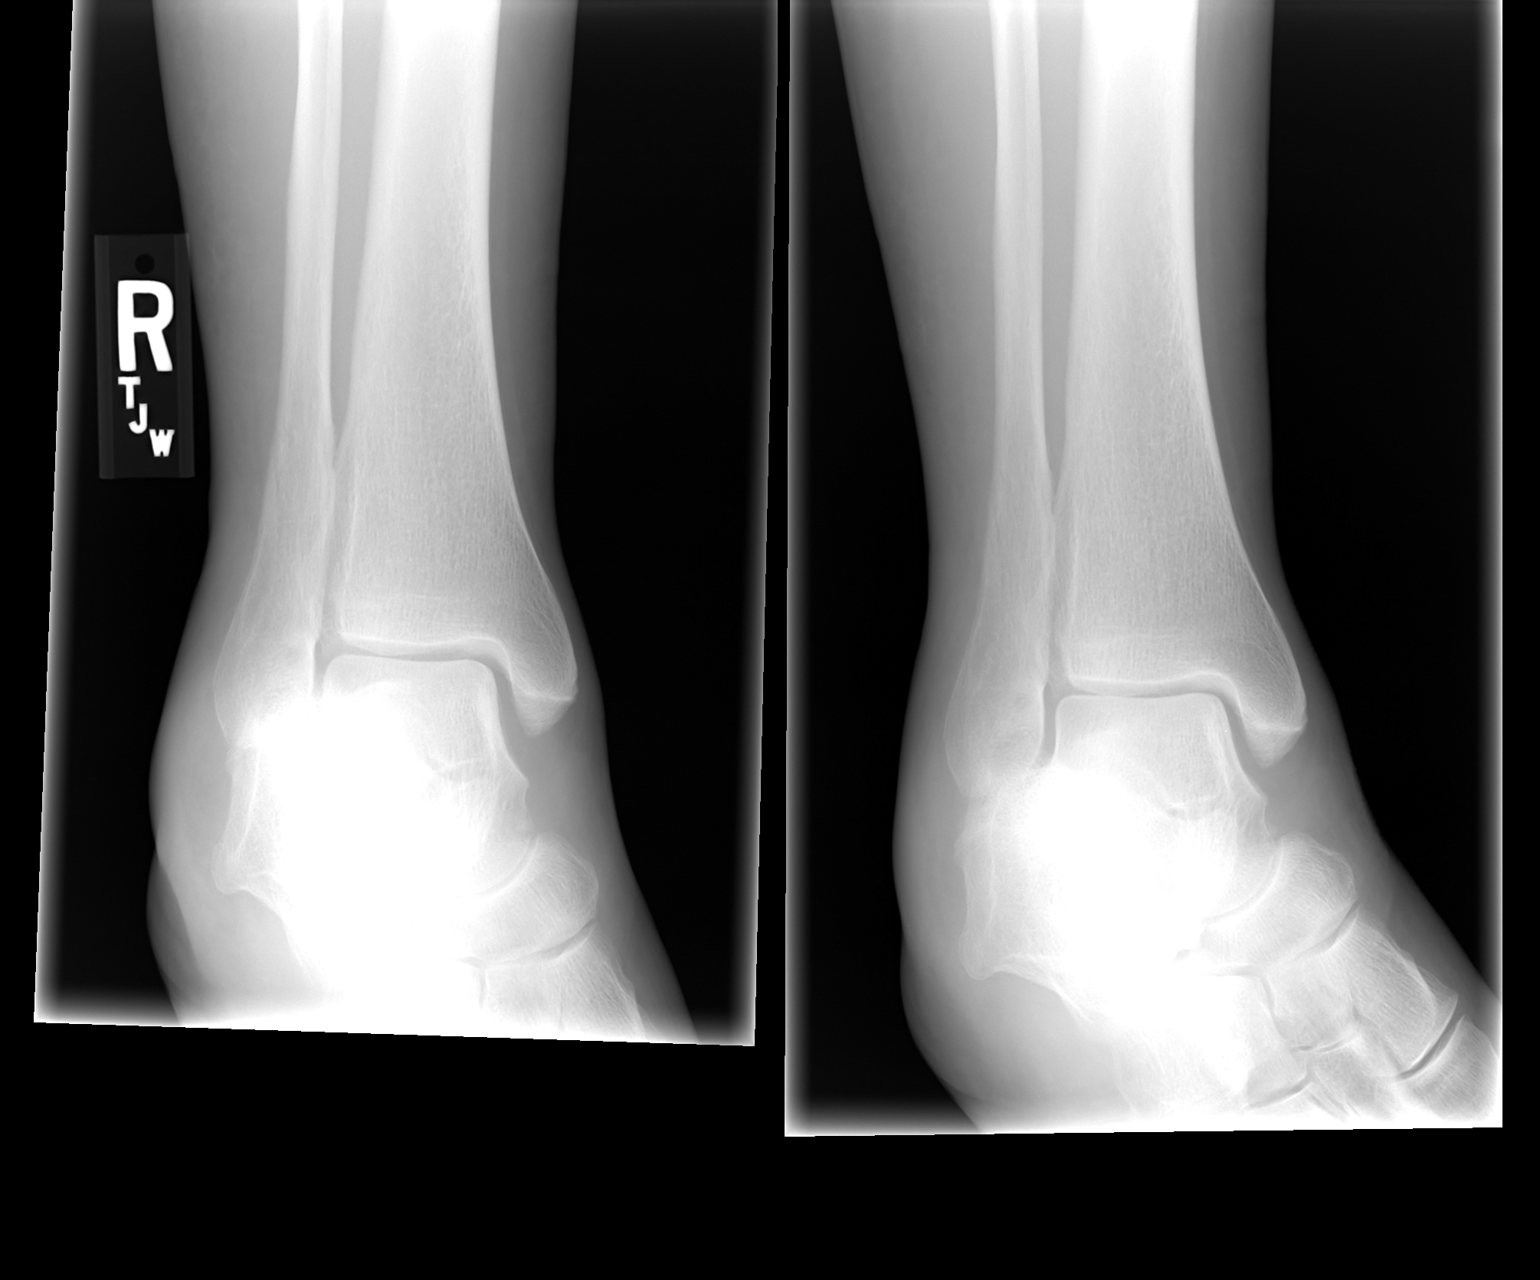

[view not recorded (2 of 2)]
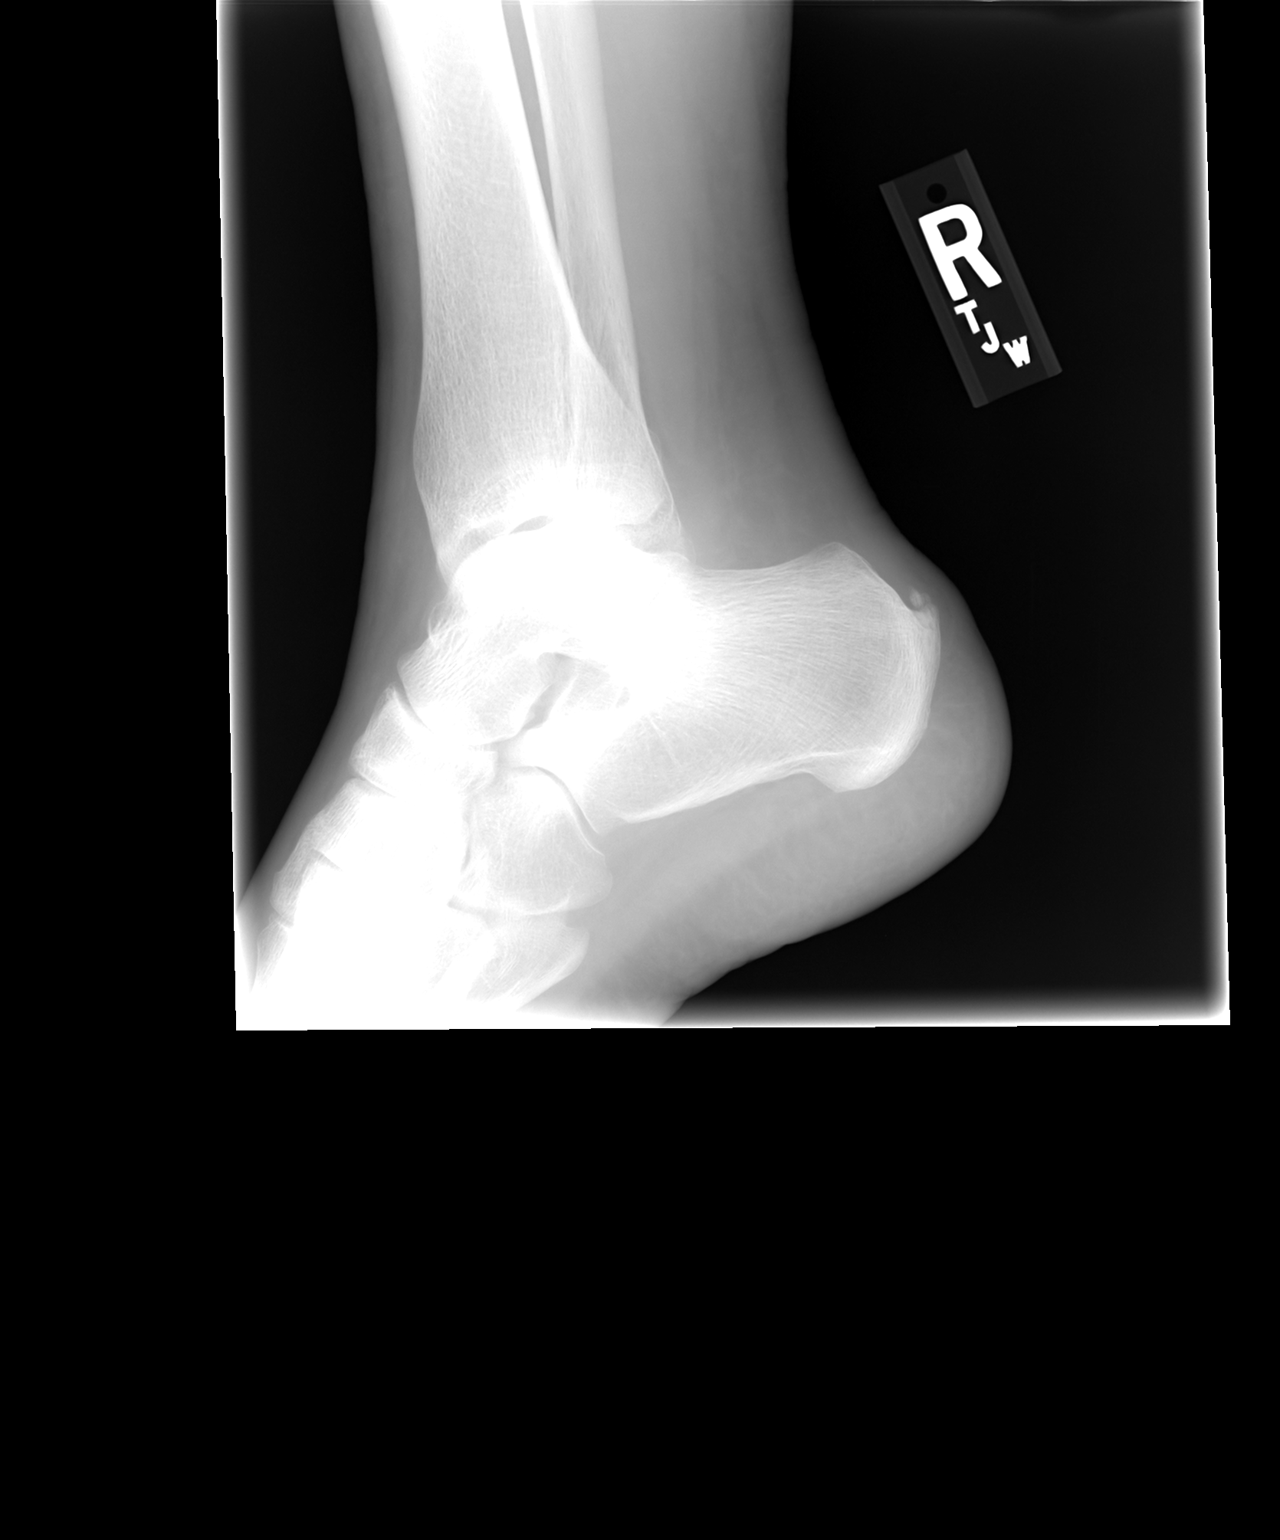

[2 of 2 positions shown; findings below may reference images not displayed]

FINDINGS: There is a linear lucency through the distal right fibula which may
well represent a nondisplaced fracture but no obvious cortical
disruption is seen. The ankle joint appears normal and alignment is
normal.
IMPRESSION: Suspect nondisplaced linear fracture of the distal right fibula.

## 2022-11-08 ENCOUNTER — Ambulatory Visit: Payer: Self-pay | Admitting: Physician Assistant

## 2022-11-08 NOTE — Progress Notes (Deleted)
New patient visit  Patient: Sandy Hart   DOB: 10/24/1955   67 y.o. Female  MRN: [id number] Visit Date: 11/08/2022  Today's healthcare provider: Debera Lat, PA-C   No chief complaint on file.  Subjective    Sandy Hart is a 67 y.o. female who presents today as a new patient to establish care.  HPI  *** Discussed the use of AI scribe software for clinical note transcription with the patient, who gave verbal consent to proceed.  History of Present Illness            Past Medical History:  Diagnosis Date   Cancer [facility])    Past Surgical History:  Procedure Laterality Date   BREAST SURGERY     thorecotomy N/A    Family Status  Relation Name Status   Mother  Deceased   MGM  Deceased   Father  Deceased   MGF  Deceased   Brother  Alive   PGM  Deceased   PGF  Deceased  No partnership data on file   Family History  Problem Relation Age of Onset   Cancer Mother 76       colon CA   Cancer Maternal Grandmother        breast ca   Stroke Father        multiple PEs,  ca suspected   Cancer Brother        NHL   Social History   Socioeconomic History   Marital status: Married    Spouse name: Not on file   Number of children: Not on file   Years of education: Not on file   Highest education level: Not on file  Occupational History   Not on file  Tobacco Use   Smoking status: Former    Current packs/day: 0.00    Average packs/day: 1 pack/day for 2.0 years (2.0 ttl pk-yrs)    Types: Cigarettes    Start date: 08/01/2010    Quit date: 07/31/2012    Years since quitting: 10.2   Smokeless tobacco: Never   Tobacco comments:    patient states ready to quit  Substance and Sexual Activity   Alcohol use: No    Comment: social rarely   Drug use: No   Sexual activity: Not on file  Other Topics Concern   Not on file  Social History Narrative   Not on file   Social Determinants of Health   Financial Resource Strain: Not on file  Food Insecurity: No Food  Insecurity (01/04/2021)   Received from [facility]   Hunger Vital Sign    Worried About Running Out of Food in the Last Year: Never true    Ran Out of Food in the Last Year: Never true  Transportation Needs: Not on file  Physical Activity: Not on file  Stress: Not on file  Social Connections: Unknown (08/14/2021)   Received from [facility]   Social Network    Social Network: Not on file   Outpatient Medications Prior to Visit  Medication Sig   ibuprofen (ADVIL,MOTRIN) 800 MG tablet Take 1 tablet (800 mg total) by mouth every 8 (eight) hours as needed.   No facility-administered medications prior to visit.   No Known Allergies  Immunization History  Administered Date(s) Administered   Moderna Sars-Covid-2 Vaccination 06/18/2019, 07/14/2019   Tdap 05/08/2020    Health Maintenance  Topic Date Due   Medicare Annual Wellness (AWV)  Never done   Hepatitis C Screening  Never  done   Zoster Vaccines- Shingrix (1 of 2) Never done   Colonoscopy  08/01/2014   MAMMOGRAM  08/21/2014   Pneumonia Vaccine 27+ Years old (1 of 1 - PCV) Never done   COVID-19 Vaccine (3 - 2023-24 season) 12/01/2021   INFLUENZA VACCINE  11/01/2022   DTaP/Tdap/Td (2 - Td or Tdap) 05/08/2030   DEXA SCAN  Completed   HPV VACCINES  Aged Out    No care team member to display  Review of Systems Except see HPI   {Insert previous labs (optional):23779} {See past labs  Heme  Chem  Endocrine  Serology  Results Review (optional):1}   Objective    There were no vitals taken for this visit. {Insert last BP/Wt (optional):23777}{See vitals history (optional):1}   Physical Exam  Depression Screen     No data to display         No results found for any visits on 11/08/22.  Assessment & Plan     *** Assessment and Plan              Encounter to establish care Welcomed to our clinic Reviewed past medical hx, social hx, family hx and surgical hx Pt advised to send all vaccination  records or screening   No follow-ups on file.     [facility]

## 2022-11-15 ENCOUNTER — Ambulatory Visit (INDEPENDENT_AMBULATORY_CARE_PROVIDER_SITE_OTHER): Payer: Medicare Other | Admitting: Family Medicine

## 2022-11-15 ENCOUNTER — Encounter: Payer: Self-pay | Admitting: Family Medicine

## 2022-11-15 VITALS — BP 110/63 | HR 63 | Ht 68.0 in | Wt 163.1 lb

## 2022-11-15 DIAGNOSIS — Z114 Encounter for screening for human immunodeficiency virus [HIV]: Secondary | ICD-10-CM | POA: Insufficient documentation

## 2022-11-15 DIAGNOSIS — F17211 Nicotine dependence, cigarettes, in remission: Secondary | ICD-10-CM

## 2022-11-15 DIAGNOSIS — R001 Bradycardia, unspecified: Secondary | ICD-10-CM | POA: Diagnosis not present

## 2022-11-15 DIAGNOSIS — E7801 Familial hypercholesterolemia: Secondary | ICD-10-CM

## 2022-11-15 DIAGNOSIS — Z1159 Encounter for screening for other viral diseases: Secondary | ICD-10-CM | POA: Insufficient documentation

## 2022-11-15 DIAGNOSIS — Z1211 Encounter for screening for malignant neoplasm of colon: Secondary | ICD-10-CM | POA: Insufficient documentation

## 2022-11-15 DIAGNOSIS — Z0001 Encounter for general adult medical examination with abnormal findings: Secondary | ICD-10-CM | POA: Diagnosis not present

## 2022-11-15 DIAGNOSIS — K219 Gastro-esophageal reflux disease without esophagitis: Secondary | ICD-10-CM | POA: Insufficient documentation

## 2022-11-15 DIAGNOSIS — E78019 Familial hypercholesterolemia, unspecified: Secondary | ICD-10-CM | POA: Insufficient documentation

## 2022-11-15 DIAGNOSIS — Z0000 Encounter for general adult medical examination without abnormal findings: Secondary | ICD-10-CM | POA: Insufficient documentation

## 2022-11-15 DIAGNOSIS — F17201 Nicotine dependence, unspecified, in remission: Secondary | ICD-10-CM | POA: Insufficient documentation

## 2022-11-15 DIAGNOSIS — Z1231 Encounter for screening mammogram for malignant neoplasm of breast: Secondary | ICD-10-CM | POA: Insufficient documentation

## 2022-11-15 DIAGNOSIS — Z8249 Family history of ischemic heart disease and other diseases of the circulatory system: Secondary | ICD-10-CM | POA: Insufficient documentation

## 2022-11-15 DIAGNOSIS — R7309 Other abnormal glucose: Secondary | ICD-10-CM | POA: Diagnosis not present

## 2022-11-15 DIAGNOSIS — Z853 Personal history of malignant neoplasm of breast: Secondary | ICD-10-CM

## 2022-11-15 NOTE — Assessment & Plan Note (Signed)
Low risk; defer lung CT No symptoms iso COPD at this time

## 2022-11-15 NOTE — Assessment & Plan Note (Signed)
Low risk screen Treatable, and curable. If left untreated Hep C can lead to cirrhosis and liver failure. Encourage routine testing; recommend repeat testing if risk factors change.  

## 2022-11-15 NOTE — Assessment & Plan Note (Signed)
Continue to recommend balanced, lower carb meals. Smaller meal size, adding snacks. Choosing water as drink of choice and increasing purposeful exercise. °Check A1c °

## 2022-11-15 NOTE — Assessment & Plan Note (Signed)
Recommend mammogram.

## 2022-11-15 NOTE — Assessment & Plan Note (Signed)
New to the area; from [facility] to be closer to 68 year old granddaughter Husband with parkinson's 3 kids Things to do to keep yourself healthy  - Exercise at least 30-45 minutes a day, 3-4 days a week.  - Eat a low-fat diet with lots of fruits and vegetables, up to 7-9 servings per day.  - Seatbelts can save your life. Wear them always.  - Smoke detectors on every level of your home, check batteries every year.  - Eye Doctor - have an eye exam every 1-2 years  - Safe sex - if you may be exposed to STDs, use a condom.  - Alcohol -  If you drink, do it moderately, less than 2 drinks per day.  - Health Care Power of Attorney. Choose someone to speak for you if you are not able.  - Depression is common in our stressful world.If you're feeling down or losing interest in things you normally enjoy, please come in for a visit.  - Violence - If anyone is threatening or hurting you, please call immediately.

## 2022-11-15 NOTE — Assessment & Plan Note (Signed)
Chronic, borderline today Defer EKG given known bradycardia and sinus S1, S2 without MRG Pt is active with normal BMI; runs 10k-1/2 marathons routinely

## 2022-11-15 NOTE — Progress Notes (Signed)
New patient visit   Patient: Sandy Hart   DOB: [birth year]   67 y.o. Female  MRN: [id number] Visit Date: 11/15/2022  Today's healthcare provider: Jacky Kindle, FNP  Patient presents for new patient visit to establish care.  Introduced to Publishing rights manager role and practice setting.  All questions answered.  Discussed provider/patient relationship and expectations.  Chief Complaint  Patient presents with   Establish Care    No concerns today    Subjective    Sandy Hart is a 67 y.o. female who presents today as a new patient to establish care.  HPI HPI     Establish Care    Additional comments: No concerns today       Last edited by Rolly Salter, CMA on 11/15/2022  2:00 PM.       Past Medical History:  Diagnosis Date   Cancer [facility])    Past Surgical History:  Procedure Laterality Date   BREAST SURGERY     thorecotomy N/A    Family Status  Relation Name Status   Mother  Deceased   Father  Deceased   Brother  Alive   MGM  Deceased   MGF  Deceased   PGM  Deceased   PGF  Deceased   Daughter  Alive   Son  Alive   Son  Alive  No partnership data on file   Family History  Problem Relation Age of Onset   Cancer Mother 20       colon CA   Stroke Father        multiple PEs,  ca suspected   Cancer Brother        NHL   Cancer Maternal Grandmother        breast ca   Hypothyroidism Daughter    Healthy Son    Healthy Son    Social History   Socioeconomic History   Marital status: Married    Spouse name: Not on file   Number of children: Not on file   Years of education: Not on file   Highest education level: Not on file  Occupational History   Not on file  Tobacco Use   Smoking status: Former    Current packs/day: 0.00    Average packs/day: 1 pack/day for 2.0 years (2.0 ttl pk-yrs)    Types: Cigarettes    Start date: 08/01/2010    Quit date: 07/31/2012    Years since quitting: 10.2   Smokeless tobacco: Never   Tobacco comments:     patient states ready to quit  Vaping Use   Vaping status: Never Used  Substance and Sexual Activity   Alcohol use: No    Comment: social rarely   Drug use: No   Sexual activity: Not on file  Other Topics Concern   Not on file  Social History Narrative   Not on file   Social Determinants of Health   Financial Resource Strain: Not on file  Food Insecurity: No Food Insecurity (01/04/2021)   Received from [facility]   Hunger Vital Sign    Worried About Running Out of Food in the Last Year: Never true    Ran Out of Food in the Last Year: Never true  Transportation Needs: Not on file  Physical Activity: Not on file  Stress: Not on file  Social Connections: Unknown (08/14/2021)   Received from [employer]   Social Network    Social Network: Not on file  Outpatient Medications Prior to Visit  Medication Sig   NONFORMULARY OR COMPOUNDED ITEM as needed.   omeprazole (PRILOSEC) 20 MG capsule Take 20 mg by mouth daily. Take one tablet by mouth daily   [DISCONTINUED] ibuprofen (ADVIL,MOTRIN) 800 MG tablet Take 1 tablet (800 mg total) by mouth every 8 (eight) hours as needed.   No facility-administered medications prior to visit.   No Known Allergies  Immunization History  Administered Date(s) Administered   Moderna Sars-Covid-2 Vaccination 06/18/2019, 07/14/2019   Tdap 05/08/2020    Health Maintenance  Topic Date Due   Hepatitis C Screening  Never done   Zoster Vaccines- Shingrix (1 of 2) Never done   Colonoscopy  08/01/2014   MAMMOGRAM  08/21/2014   Pneumonia Vaccine 61+ Years old (1 of 1 - PCV) Never done   COVID-19 Vaccine (3 - 2023-24 season) 12/01/2021   INFLUENZA VACCINE  07/01/2023 (Originally 11/01/2022)   Medicare Annual Wellness (AWV)  11/15/2023   DTaP/Tdap/Td (2 - Td or Tdap) 05/08/2030   DEXA SCAN  Completed   HPV VACCINES  Aged Out    Patient Care Team: Jacky Kindle, FNP as PCP - General (Family Medicine)   Objective    BP 110/63 (BP Location:  Left Arm, Patient Position: Sitting, Cuff Size: Large)   Pulse 63   Ht 5\' 8"  (1.727 m)   Wt 163 lb 1.6 oz (74 kg)   SpO2 100%   BMI 24.80 kg/m   Physical Exam Vitals and nursing note reviewed.  Constitutional:      General: She is awake. She is not in acute distress.    Appearance: Normal appearance. She is well-developed, well-groomed and normal weight. She is not ill-appearing, toxic-appearing or diaphoretic.  HENT:     Head: Normocephalic and atraumatic.     Jaw: There is normal jaw occlusion. No trismus, tenderness, swelling or pain on movement.     Right Ear: Hearing, tympanic membrane, ear canal and external ear normal. There is no impacted cerumen.     Left Ear: Hearing, tympanic membrane, ear canal and external ear normal. There is no impacted cerumen.     Nose: Nose normal. No congestion or rhinorrhea.     Right Turbinates: Not enlarged, swollen or pale.     Left Turbinates: Not enlarged, swollen or pale.     Right Sinus: No maxillary sinus tenderness or frontal sinus tenderness.     Left Sinus: No maxillary sinus tenderness or frontal sinus tenderness.     Mouth/Throat:     Lips: Pink.     Mouth: Mucous membranes are moist. No injury.     Tongue: No lesions.     Pharynx: Oropharynx is clear. Uvula midline. No pharyngeal swelling, oropharyngeal exudate, posterior oropharyngeal erythema or uvula swelling.     Tonsils: No tonsillar exudate or tonsillar abscesses.  Eyes:     General: Lids are normal. Lids are everted, no foreign bodies appreciated. Vision grossly intact. Gaze aligned appropriately. No allergic shiner or visual field deficit.       Right eye: No discharge.        Left eye: No discharge.     Extraocular Movements: Extraocular movements intact.     Conjunctiva/sclera: Conjunctivae normal.     Right eye: Right conjunctiva is not injected. No exudate.    Left eye: Left conjunctiva is not injected. No exudate.    Pupils: Pupils are equal, round, and reactive to  light.  Neck:     Thyroid: No thyroid  mass, thyromegaly or thyroid tenderness.     Vascular: No carotid bruit.     Trachea: Trachea normal.  Cardiovascular:     Rate and Rhythm: Normal rate and regular rhythm.     Pulses: Normal pulses.          Carotid pulses are 2+ on the right side and 2+ on the left side.      Radial pulses are 2+ on the right side and 2+ on the left side.       Dorsalis pedis pulses are 2+ on the right side and 2+ on the left side.       Posterior tibial pulses are 2+ on the right side and 2+ on the left side.     Heart sounds: Normal heart sounds, S1 normal and S2 normal. No murmur heard.    No friction rub. No gallop.  Pulmonary:     Effort: Pulmonary effort is normal. No respiratory distress.     Breath sounds: Normal breath sounds and air entry. No stridor. No wheezing, rhonchi or rales.  Chest:     Chest wall: No tenderness.  Abdominal:     General: Abdomen is flat. Bowel sounds are normal. There is no distension.     Palpations: Abdomen is soft. There is no mass.     Tenderness: There is no abdominal tenderness. There is no right CVA tenderness, left CVA tenderness, guarding or rebound.     Hernia: No hernia is present.  Genitourinary:    Comments: Exam deferred; denies complaints Musculoskeletal:        General: No swelling, tenderness, deformity or signs of injury. Normal range of motion.     Cervical back: Full passive range of motion without pain, normal range of motion and neck supple. No edema, rigidity or tenderness. No muscular tenderness.     Right lower leg: No edema.     Left lower leg: No edema.  Lymphadenopathy:     Cervical: No cervical adenopathy.     Right cervical: No superficial, deep or posterior cervical adenopathy.    Left cervical: No superficial, deep or posterior cervical adenopathy.  Skin:    General: Skin is warm and dry.     Capillary Refill: Capillary refill takes less than 2 seconds.     Coloration: Skin is not jaundiced  or pale.     Findings: No bruising, erythema, lesion or rash.  Neurological:     General: No focal deficit present.     Mental Status: She is alert and oriented to person, place, and time. Mental status is at baseline.     GCS: GCS eye subscore is 4. GCS verbal subscore is 5. GCS motor subscore is 6.     Sensory: Sensation is intact. No sensory deficit.     Motor: Motor function is intact. No weakness.     Coordination: Coordination is intact. Coordination normal.     Gait: Gait is intact. Gait normal.  Psychiatric:        Attention and Perception: Attention and perception normal.        Mood and Affect: Mood and affect normal.        Speech: Speech normal.        Behavior: Behavior normal. Behavior is cooperative.        Thought Content: Thought content normal.        Cognition and Memory: Cognition and memory normal.        Judgment: Judgment normal.  Depression Screen     No data to display         No results found for any visits on 11/15/22.  Assessment & Plan      Problem List Items Addressed This Visit       Digestive   Gastroesophageal reflux disease    Chronic, stable Self medicated with prilosec 20 mg daily       Relevant Medications   omeprazole (PRILOSEC) 20 MG capsule     Other   Bradycardia    Chronic, borderline today Defer EKG given known bradycardia and sinus S1, S2 without MRG Pt is active with normal BMI; runs 10k-1/2 marathons routinely       Relevant Orders   CBC with Differential/Platelet   Comprehensive Metabolic Panel (CMET)   TSH + free T4   Lipid panel   Disorder of blood glucose    Continue to recommend balanced, lower carb meals. Smaller meal size, adding snacks. Choosing water as drink of choice and increasing purposeful exercise. Check A1c      Relevant Orders   Hemoglobin A1c   Encounter for hepatitis C screening test for low risk patient    Low risk screen Treatable, and curable. If left untreated Hep C can lead to  cirrhosis and liver failure. Encourage routine testing; recommend repeat testing if risk factors change.       Relevant Orders   Hepatitis C Antibody   Encounter for subsequent annual wellness visit (AWV) in Medicare patient - Primary    New to the area; from [facility] to be closer to 32 year old granddaughter Husband with parkinson's 3 kids Things to do to keep yourself healthy  - Exercise at least 30-45 minutes a day, 3-4 days a week.  - Eat a low-fat diet with lots of fruits and vegetables, up to 7-9 servings per day.  - Seatbelts can save your life. Wear them always.  - Smoke detectors on every level of your home, check batteries every year.  - Eye Doctor - have an eye exam every 1-2 years  - Safe sex - if you may be exposed to STDs, use a condom.  - Alcohol -  If you drink, do it moderately, less than 2 drinks per day.  - Health Care Power of Attorney. Choose someone to speak for you if you are not able.  - Depression is common in our stressful world.If you're feeling down or losing interest in things you normally enjoy, please come in for a visit.  - Violence - If anyone is threatening or hurting you, please call immediately.       Familial hypercholesterolemia    Check LP recommend diet low in saturated fat and regular exercise - 30 min at least 5 times per week       Relevant Orders   Lipid panel   Family history of pulmonary embolism    Pt without symptoms; low risk given non sedentary life style, non obese, no tobacco       Relevant Orders   CBC with Differential/Platelet   Comprehensive Metabolic Panel (CMET)   TSH + free T4   Lipid panel   History of breast cancer in female    Recommend mammogram      Relevant Orders   MM 3D SCREENING MAMMOGRAM BILATERAL BREAST   Screen for colon cancer    Wishes to perform cologuard at this time despite known colon cancer in mother Discussed risks vs benefits and change of false  positive vs false negative        Relevant Orders   Cologuard   Screening mammogram for breast cancer    Due for screening for mammogram, denies breast concerns, provided with phone number to call and schedule appointment for mammogram. Encouraged to repeat breast cancer screening every 1-2 years.       Relevant Orders   MM 3D SCREENING MAMMOGRAM BILATERAL BREAST   Tobacco dependence in remission    Low risk; defer lung CT No symptoms iso COPD at this time      Return in about 1 year (around 11/18/2023) for annual examination.    Leilani Merl, FNP, have reviewed all documentation for this visit. The documentation on 11/15/22 for the exam, diagnosis, procedures, and orders are all accurate and complete.  Jacky Kindle, FNP  [facility] [phone] (phone) [phone] (fax)  [facility]

## 2022-11-15 NOTE — Assessment & Plan Note (Signed)
Check LP recommend diet low in saturated fat and regular exercise - 30 min at least 5 times per week  

## 2022-11-15 NOTE — Assessment & Plan Note (Signed)
Chronic, stable Self medicated with prilosec 20 mg daily

## 2022-11-15 NOTE — Assessment & Plan Note (Signed)
Wishes to perform cologuard at this time despite known colon cancer in mother Discussed risks vs benefits and change of false positive vs false negative

## 2022-11-15 NOTE — Assessment & Plan Note (Signed)
Due for screening for mammogram, denies breast concerns, provided with phone number to call and schedule appointment for mammogram. Encouraged to repeat breast cancer screening every 1-2 years.  

## 2022-11-15 NOTE — Assessment & Plan Note (Signed)
Pt without symptoms; low risk given non sedentary life style, non obese, no tobacco

## 2022-11-15 NOTE — Patient Instructions (Addendum)
Please call and schedule your mammogram:  [facility]  [address], [address] [facility] [place],  [place]  [address] Get Driving Directions Main: [phone]  Sunday:Closed Monday:7:20 AM - 5:00 PM Tuesday:7:20 AM - 5:00 PM Wednesday:7:20 AM - 5:00 PM Thursday:7:20 AM - 5:00 PM Friday:7:20 AM - 4:30 PM Saturday:Closed  The CDC recommends two doses of Shingrix (the new shingles vaccine) separated by 2 to 6 months for adults age 50 years and older. I recommend checking with your insurance plan regarding coverage for this vaccine.    

## 2022-11-16 LAB — LIPID PANEL
Chol/HDL Ratio: 2.8 ratio (ref 0.0–4.4)
Cholesterol, Total: 205 mg/dL — ABNORMAL HIGH (ref 100–199)
HDL: 73 mg/dL (ref >39)
LDL Chol Calc (NIH): 108 mg/dL — ABNORMAL HIGH (ref 0–99)
Triglycerides: 141 mg/dL (ref 0–149)
VLDL Cholesterol Cal: 24 mg/dL (ref 5–40)

## 2022-11-16 LAB — TSH+FREE T4
Free T4: 1.17 ng/dL (ref 0.82–1.77)
TSH: 1.61 u[IU]/mL (ref 0.450–4.500)

## 2022-11-16 LAB — COMPREHENSIVE METABOLIC PANEL
ALT: 14 IU/L (ref 0–32)
AST: 20 IU/L (ref 0–40)
Albumin: 4.5 g/dL (ref 3.9–4.9)
Alkaline Phosphatase: 69 IU/L (ref 44–121)
BUN/Creatinine Ratio: 16 (ref 12–28)
BUN: 17 mg/dL (ref 8–27)
Bilirubin Total: 0.7 mg/dL (ref 0.0–1.2)
CO2: 23 mmol/L (ref 20–29)
Calcium: 9.2 mg/dL (ref 8.7–10.3)
Chloride: 104 mmol/L (ref 96–106)
Creatinine, Ser: 1.08 mg/dL — ABNORMAL HIGH (ref 0.57–1.00)
Globulin, Total: 1.9 g/dL (ref 1.5–4.5)
Glucose: 96 mg/dL (ref 70–99)
Potassium: 4.2 mmol/L (ref 3.5–5.2)
Sodium: 141 mmol/L (ref 134–144)
Total Protein: 6.4 g/dL (ref 6.0–8.5)
eGFR: 56 mL/min/{1.73_m2} — ABNORMAL LOW (ref >59)

## 2022-11-16 LAB — CBC WITH DIFFERENTIAL/PLATELET
Basophils Absolute: 0 10*3/uL (ref 0.0–0.2)
Basos: 1 %
EOS (ABSOLUTE): 0.2 10*3/uL (ref 0.0–0.4)
Eos: 3 %
Hematocrit: 38 % (ref 34.0–46.6)
Hemoglobin: 12.7 g/dL (ref 11.1–15.9)
Immature Grans (Abs): 0 10*3/uL (ref 0.0–0.1)
Immature Granulocytes: 0 %
Lymphocytes Absolute: 2.4 10*3/uL (ref 0.7–3.1)
Lymphs: 42 %
MCH: 30.6 pg (ref 26.6–33.0)
MCHC: 33.4 g/dL (ref 31.5–35.7)
MCV: 92 fL (ref 79–97)
Monocytes Absolute: 0.4 10*3/uL (ref 0.1–0.9)
Monocytes: 7 %
Neutrophils Absolute: 2.6 10*3/uL (ref 1.4–7.0)
Neutrophils: 47 %
Platelets: 218 10*3/uL (ref 150–450)
RBC: 4.15 x10E6/uL (ref 3.77–5.28)
RDW: 12.2 % (ref 11.7–15.4)
WBC: 5.6 10*3/uL (ref 3.4–10.8)

## 2022-11-16 LAB — HEMOGLOBIN A1C
Est. average glucose Bld gHb Est-mCnc: 97 mg/dL
Hgb A1c MFr Bld: 5 % (ref 4.8–5.6)

## 2022-11-16 LAB — HEPATITIS C ANTIBODY: Hep C Virus Ab: NONREACTIVE

## 2022-12-06 LAB — COLOGUARD: COLOGUARD: NEGATIVE

## 2022-12-07 ENCOUNTER — Other Ambulatory Visit: Payer: Self-pay | Admitting: Family Medicine

## 2023-04-24 ENCOUNTER — Encounter: Payer: Self-pay | Admitting: Nurse Practitioner

## 2023-04-24 NOTE — Progress Notes (Unsigned)
There were no vitals taken for this visit.   Subjective:    Patient ID: BLIA ALVARDO, female    DOB: [birth year], 68 y.o.   MRN: [id number]  HPI: ARNETT CARDY is a 68 y.o. female  No chief complaint on file.   Discussed the use of AI scribe software for clinical note transcription with the patient, who gave verbal consent to proceed.  History of Present Illness            No data to display          Relevant past medical, surgical, family and social history reviewed and updated as indicated. Interim medical history since our last visit reviewed. Allergies and medications reviewed and updated.  Review of Systems  Per HPI unless specifically indicated above     Objective:    There were no vitals taken for this visit.  {Vitals History (Optional):23777} Wt Readings from Last 3 Encounters:  11/15/22 163 lb 1.6 oz (74 kg)  12/02/13 144 lb 4 oz (65.4 kg)  11/16/13 140 lb 12 oz (63.8 kg)    Physical Exam  Results for orders placed or performed in visit on 11/15/22  CBC with Differential/Platelet   Collection Time: 11/15/22  2:32 PM  Result Value Ref Range   WBC 5.6 3.4 - 10.8 x10E3/uL   RBC 4.15 3.77 - 5.28 x10E6/uL   Hemoglobin 12.7 11.1 - 15.9 g/dL   Hematocrit 78.2 95.6 - 46.6 %   MCV 92 79 - 97 fL   MCH 30.6 26.6 - 33.0 pg   MCHC 33.4 31.5 - 35.7 g/dL   RDW 21.3 08.6 - 57.8 %   Platelets 218 150 - 450 x10E3/uL   Neutrophils 47 Not Estab. %   Lymphs 42 Not Estab. %   Monocytes 7 Not Estab. %   Eos 3 Not Estab. %   Basos 1 Not Estab. %   Neutrophils Absolute 2.6 1.4 - 7.0 x10E3/uL   Lymphocytes Absolute 2.4 0.7 - 3.1 x10E3/uL   Monocytes Absolute 0.4 0.1 - 0.9 x10E3/uL   EOS (ABSOLUTE) 0.2 0.0 - 0.4 x10E3/uL   Basophils Absolute 0.0 0.0 - 0.2 x10E3/uL   Immature Granulocytes 0 Not Estab. %   Immature Grans (Abs) 0.0 0.0 - 0.1 x10E3/uL  Comprehensive Metabolic Panel (CMET)   Collection Time: 11/15/22  2:32 PM  Result Value Ref Range   Glucose  96 70 - 99 mg/dL   BUN 17 8 - 27 mg/dL   Creatinine, Ser 4.69 (H) 0.57 - 1.00 mg/dL   eGFR 56 (L) >62 XB/MWU/1.32   BUN/Creatinine Ratio 16 12 - 28   Sodium 141 134 - 144 mmol/L   Potassium 4.2 3.5 - 5.2 mmol/L   Chloride 104 96 - 106 mmol/L   CO2 23 20 - 29 mmol/L   Calcium 9.2 8.7 - 10.3 mg/dL   Total Protein 6.4 6.0 - 8.5 g/dL   Albumin 4.5 3.9 - 4.9 g/dL   Globulin, Total 1.9 1.5 - 4.5 g/dL   Bilirubin Total 0.7 0.0 - 1.2 mg/dL   Alkaline Phosphatase 69 44 - 121 IU/L   AST 20 0 - 40 IU/L   ALT 14 0 - 32 IU/L  TSH + free T4   Collection Time: 11/15/22  2:32 PM  Result Value Ref Range   TSH 1.610 0.450 - 4.500 uIU/mL   Free T4 1.17 0.82 - 1.77 ng/dL  Lipid panel   Collection Time: 11/15/22  2:32 PM  Result Value Ref  Range   Cholesterol, Total 205 (H) 100 - 199 mg/dL   Triglycerides 045 0 - 149 mg/dL   HDL 73 >40 mg/dL   VLDL Cholesterol Cal 24 5 - 40 mg/dL   LDL Chol Calc (NIH) 981 (H) 0 - 99 mg/dL   Chol/HDL Ratio 2.8 0.0 - 4.4 ratio  Hepatitis C Antibody   Collection Time: 11/15/22  2:32 PM  Result Value Ref Range   Hep C Virus Ab Non Reactive Non Reactive  Hemoglobin A1c   Collection Time: 11/15/22  2:32 PM  Result Value Ref Range   Hgb A1c MFr Bld 5.0 4.8 - 5.6 %   Est. average glucose Bld gHb Est-mCnc 97 mg/dL  Cologuard   Collection Time: 11/28/22  9:30 AM  Result Value Ref Range   COLOGUARD Negative Negative   {Labs (Optional):23779}    Assessment & Plan:   Problem List Items Addressed This Visit   None    Assessment and Plan             Follow up plan: No follow-ups on file.

## 2023-04-25 ENCOUNTER — Ambulatory Visit (INDEPENDENT_AMBULATORY_CARE_PROVIDER_SITE_OTHER): Payer: Medicare Other | Admitting: Nurse Practitioner

## 2023-04-25 ENCOUNTER — Encounter: Payer: Self-pay | Admitting: Nurse Practitioner

## 2023-04-25 VITALS — BP 122/62 | HR 82 | Temp 97.8°F | Resp 16 | Ht 68.0 in | Wt 159.7 lb

## 2023-04-25 DIAGNOSIS — Z1231 Encounter for screening mammogram for malignant neoplasm of breast: Secondary | ICD-10-CM

## 2023-04-25 DIAGNOSIS — E7801 Familial hypercholesterolemia: Secondary | ICD-10-CM | POA: Diagnosis not present

## 2023-04-25 DIAGNOSIS — F330 Major depressive disorder, recurrent, mild: Secondary | ICD-10-CM

## 2023-04-25 DIAGNOSIS — K219 Gastro-esophageal reflux disease without esophagitis: Secondary | ICD-10-CM | POA: Diagnosis not present

## 2023-04-25 DIAGNOSIS — Z853 Personal history of malignant neoplasm of breast: Secondary | ICD-10-CM

## 2023-04-25 DIAGNOSIS — R7989 Other specified abnormal findings of blood chemistry: Secondary | ICD-10-CM

## 2023-04-25 MED ORDER — SERTRALINE HCL 25 MG PO TABS: 25.0000 mg | ORAL_TABLET | Freq: Every day | ORAL | 0 refills | Status: DC

## 2023-04-26 ENCOUNTER — Encounter: Payer: Self-pay | Admitting: Nurse Practitioner

## 2023-04-26 LAB — COMPLETE METABOLIC PANEL WITH GFR
AG Ratio: 2 (calc) (ref 1.0–2.5)
ALT: 12 U/L (ref 6–29)
AST: 19 U/L (ref 10–35)
Albumin: 4.4 g/dL (ref 3.6–5.1)
Alkaline phosphatase (APISO): 58 U/L (ref 37–153)
BUN/Creatinine Ratio: 12 (calc) (ref 6–22)
BUN: 17 mg/dL (ref 7–25)
CO2: 27 mmol/L (ref 20–32)
Calcium: 9.6 mg/dL (ref 8.6–10.4)
Chloride: 102 mmol/L (ref 98–110)
Creat: 1.4 mg/dL — ABNORMAL HIGH (ref 0.50–1.05)
Globulin: 2.2 g/dL (ref 1.9–3.7)
Glucose, Bld: 87 mg/dL (ref 65–99)
Potassium: 4.2 mmol/L (ref 3.5–5.3)
Sodium: 137 mmol/L (ref 135–146)
Total Bilirubin: 0.8 mg/dL (ref 0.2–1.2)
Total Protein: 6.6 g/dL (ref 6.1–8.1)
eGFR: 41 mL/min/{1.73_m2} — ABNORMAL LOW (ref >=60)

## 2023-05-23 ENCOUNTER — Ambulatory Visit: Payer: Self-pay | Admitting: Nurse Practitioner

## 2023-05-29 ENCOUNTER — Encounter: Payer: Self-pay | Admitting: Nurse Practitioner

## 2023-05-29 ENCOUNTER — Ambulatory Visit (INDEPENDENT_AMBULATORY_CARE_PROVIDER_SITE_OTHER): Payer: Medicare Other | Admitting: Nurse Practitioner

## 2023-05-29 VITALS — BP 116/68 | HR 94 | Resp 18 | Ht 68.0 in | Wt 158.0 lb

## 2023-05-29 DIAGNOSIS — R944 Abnormal results of kidney function studies: Secondary | ICD-10-CM

## 2023-05-29 DIAGNOSIS — R319 Hematuria, unspecified: Secondary | ICD-10-CM

## 2023-05-29 DIAGNOSIS — N1832 Chronic kidney disease, stage 3b: Secondary | ICD-10-CM | POA: Diagnosis not present

## 2023-05-29 LAB — POCT URINALYSIS DIPSTICK
Bilirubin, UA: NEGATIVE
Blood, UA: POSITIVE
Glucose, UA: NEGATIVE
Ketones, UA: NEGATIVE
Nitrite, UA: NEGATIVE
Odor: NORMAL
Protein, UA: NEGATIVE
Spec Grav, UA: <=1.005 — AB (ref 1.010–1.025)
Urobilinogen, UA: 0.2 U/dL
pH, UA: 6.5 (ref 5.0–8.0)

## 2023-05-29 NOTE — Patient Instructions (Signed)
 Ultrasound scheduling number [phone]

## 2023-05-29 NOTE — Progress Notes (Signed)
 BP 116/68   Pulse 94   Resp 18   Ht 5\' 8"  (1.727 m)   Wt 158 lb (71.7 kg)   SpO2 94%   BMI 24.02 kg/m    Subjective:    Patient ID: Sandy Hart, female    DOB: 1955/07/13, 68 y.o.   MRN: [id number]  HPI: Sandy Hart is a 68 y.o. female  Chief Complaint  Patient presents with   Medical Management of Chronic Issues    Kidney function    Discussed the use of AI scribe software for clinical note transcription with the patient, who gave verbal consent to proceed.  History of Present Illness   The patient presents with concerns about a recent decrease in kidney function. They report that their kidney function was lower than previous tests, which they attribute to weight lifting. They deny any symptoms of kidney disease, such as nausea, vomiting, or changes in urination. They have a family history of thyroid disease, but deny any symptoms of thyroid disease. They also report feeling well overall and have stopped taking antidepressants due to side effects, including facial tics. They deny any current use of excessive ibuprofen or other NSAIDs, and only take Prilosec for gastroesophageal reflux disease.       04/25/2023    2:47 PM  Depression screen PHQ 2/9  Decreased Interest 0  Down, Depressed, Hopeless 2  PHQ - 2 Score 2  Altered sleeping 3  Tired, decreased energy 0  Change in appetite 0  Feeling bad or failure about yourself  0  Trouble concentrating 3  Moving slowly or fidgety/restless 0  Suicidal thoughts 0  PHQ-9 Score 8  Difficult doing work/chores Somewhat difficult    Relevant past medical, surgical, family and social history reviewed and updated as indicated. Interim medical history since our last visit reviewed. Allergies and medications reviewed and updated.  Review of Systems  Constitutional: Negative for fever or weight change.  Respiratory: Negative for cough and shortness of breath.   Cardiovascular: Negative for chest pain or palpitations.   Gastrointestinal: Negative for abdominal pain, no bowel changes.  Musculoskeletal: Negative for gait problem or joint swelling.  Skin: Negative for rash.  Neurological: Negative for dizziness or headache.  No other specific complaints in a complete review of systems (except as listed in HPI above).      Objective:    BP 116/68   Pulse 94   Resp 18   Ht 5\' 8"  (1.727 m)   Wt 158 lb (71.7 kg)   SpO2 94%   BMI 24.02 kg/m    Wt Readings from Last 3 Encounters:  05/29/23 158 lb (71.7 kg)  04/25/23 159 lb 11.2 oz (72.4 kg)  11/15/22 163 lb 1.6 oz (74 kg)    Physical Exam Vitals reviewed.  Constitutional:      Appearance: Normal appearance.  HENT:     Head: Normocephalic.  Cardiovascular:     Rate and Rhythm: Normal rate and regular rhythm.  Pulmonary:     Effort: Pulmonary effort is normal.     Breath sounds: Normal breath sounds.  Musculoskeletal:        General: Normal range of motion.  Skin:    General: Skin is warm and dry.  Neurological:     General: No focal deficit present.     Mental Status: She is alert and oriented to person, place, and time. Mental status is at baseline.  Psychiatric:        Mood and  Affect: Mood normal.        Behavior: Behavior normal.        Thought Content: Thought content normal.        Judgment: Judgment normal.     Results for orders placed or performed in visit on 05/29/23  POCT urinalysis dipstick   Collection Time: 05/29/23 10:26 AM  Result Value Ref Range   Color, UA yellow    Clarity, UA clear    Glucose, UA Negative Negative   Bilirubin, UA neg    Ketones, UA neg    Spec Grav, UA <=1.005 (A) 1.010 - 1.025   Blood, UA positive    pH, UA 6.5 5.0 - 8.0   Protein, UA Negative Negative   Urobilinogen, UA 0.2 0.2 or 1.0 E.U./dL   Nitrite, UA neg    Leukocytes, UA Trace (A) Negative   Appearance clear    Odor normal        Assessment & Plan:   Problem List Items Addressed This Visit       Genitourinary   Stage 3b  chronic kidney disease (HCC) - Primary   Relevant Orders   PTH, intact and calcium   Renal Function Panel   POCT urinalysis dipstick (Completed)   Urinalysis, Complete   Microalbumin / creatinine urine ratio   Urine Culture   Other Visit Diagnoses       Decreased GFR       Relevant Orders   PTH, intact and calcium   Renal Function Panel   POCT urinalysis dipstick (Completed)   Urinalysis, Complete   Microalbumin / creatinine urine ratio   Urine Culture     Hematuria, unspecified type       Relevant Orders   Urine Culture        Assessment and Plan    Renal Function: Decreased GFR (from 56 to 41 within 6 months). Possible causes include weight lifting and dehydration. -Order blood work to recheck kidney function and parathyroid levels. -If kidney function remains decreased, order renal ultrasound. -Encourage increased water intake.  Urinalysis: Presence of leukocytes and blood in urine. -Order urine culture to rule out infection. -Order urinalysis with microscopy to quantify red blood cells and assess for proteinuria.  Depression: Patient has stopped taking antidepressants due to side effects (tremors, facial tics). -Encourage patient to manage symptoms without medication, as kidney function needs to be considered.  Gastroesophageal Reflux Disease: Patient is currently taking Prilosec. -Continue Prilosec as needed.        Follow up plan: Return if symptoms worsen or fail to improve.

## 2023-05-30 ENCOUNTER — Other Ambulatory Visit: Payer: Self-pay | Admitting: Nurse Practitioner

## 2023-05-30 DIAGNOSIS — R944 Abnormal results of kidney function studies: Secondary | ICD-10-CM

## 2023-05-30 LAB — URINE CULTURE
MICRO NUMBER:: 16132788
Result:: NO GROWTH
SPECIMEN QUALITY:: ADEQUATE

## 2023-05-31 ENCOUNTER — Encounter: Payer: Self-pay | Admitting: Nurse Practitioner

## 2023-05-31 LAB — URINALYSIS, COMPLETE
Bacteria, UA: NONE SEEN /HPF
Bilirubin Urine: NEGATIVE
Glucose, UA: NEGATIVE
Hgb urine dipstick: NEGATIVE
Hyaline Cast: NONE SEEN /LPF
Ketones, ur: NEGATIVE
Leukocytes,Ua: NEGATIVE
Nitrite: NEGATIVE
Protein, ur: NEGATIVE
RBC / HPF: NONE SEEN /HPF (ref 0–2)
Specific Gravity, Urine: 1.014 (ref 1.001–1.035)
Squamous Epithelial / HPF: NONE SEEN /HPF (ref <=5)
WBC, UA: NONE SEEN /HPF (ref 0–5)
pH: 6 (ref 5.0–8.0)

## 2023-05-31 LAB — RENAL FUNCTION PANEL
Albumin: 4.4 g/dL (ref 3.6–5.1)
BUN: 17 mg/dL (ref 7–25)
CO2: 28 mmol/L (ref 20–32)
Calcium: 9.1 mg/dL (ref 8.6–10.4)
Chloride: 103 mmol/L (ref 98–110)
Creat: 1 mg/dL (ref 0.50–1.05)
Glucose, Bld: 85 mg/dL (ref 65–99)
Phosphorus: 3.3 mg/dL (ref 2.1–4.3)
Potassium: 4.4 mmol/L (ref 3.5–5.3)
Sodium: 137 mmol/L (ref 135–146)

## 2023-05-31 LAB — MICROALBUMIN / CREATININE URINE RATIO
Creatinine, Urine: 74 mg/dL (ref 20–275)
Microalb Creat Ratio: 3 mg/g{creat} (ref <30)
Microalb, Ur: 0.2 mg/dL

## 2023-05-31 LAB — PTH, INTACT AND CALCIUM
Calcium: 9.1 mg/dL (ref 8.6–10.4)
PTH: 47 pg/mL (ref 16–77)

## 2023-05-31 LAB — TEST AUTHORIZATION

## 2023-05-31 LAB — CREATININE WITH EST GFR
Creat: 1 mg/dL (ref 0.50–1.05)
eGFR: 61 mL/min/{1.73_m2} (ref >=60)

## 2023-09-24 ENCOUNTER — Encounter: Payer: Self-pay | Admitting: Nurse Practitioner

## 2023-09-24 ENCOUNTER — Other Ambulatory Visit: Payer: Self-pay | Admitting: Nurse Practitioner

## 2023-09-24 DIAGNOSIS — Z853 Personal history of malignant neoplasm of breast: Secondary | ICD-10-CM

## 2023-10-06 ENCOUNTER — Other Ambulatory Visit: Payer: Self-pay | Admitting: Medical Genetics

## 2023-10-10 ENCOUNTER — Ambulatory Visit
Admission: RE | Admit: 2023-10-10 | Discharge: 2023-10-10 | Disposition: A | Discharge status: Home / Self Care | Payer: Self-pay | Source: Ambulatory Visit | Attending: Nurse Practitioner | Admitting: Nurse Practitioner

## 2023-10-10 ENCOUNTER — Other Ambulatory Visit: Payer: Self-pay | Admitting: *Deleted

## 2023-10-10 ENCOUNTER — Other Ambulatory Visit
Admission: RE | Admit: 2023-10-10 | Discharge: 2023-10-10 | Disposition: A | Discharge status: Home / Self Care | Payer: Self-pay | Source: Ambulatory Visit | Attending: Medical Genetics | Admitting: Medical Genetics

## 2023-10-10 DIAGNOSIS — Z1231 Encounter for screening mammogram for malignant neoplasm of breast: Secondary | ICD-10-CM

## 2023-10-10 HISTORY — DX: Personal history of irradiation: Z92.3

## 2023-10-10 HISTORY — DX: Personal history of antineoplastic chemotherapy: Z92.21

## 2023-10-15 ENCOUNTER — Telehealth: Payer: Self-pay | Admitting: Licensed Clinical Social Worker

## 2023-10-15 NOTE — Telephone Encounter (Signed)
 Left voicemail for patient to see if she would be willing to switch to a phone visit tomorrow at 9 am.   Dena Cary, MS, [facility] Genetic Counselor [email or web address] Phone: [phone]

## 2023-10-16 ENCOUNTER — Inpatient Hospital Stay: Attending: Genetic Counselor | Admitting: Genetic Counselor

## 2023-10-16 ENCOUNTER — Encounter: Payer: Self-pay | Admitting: Genetic Counselor

## 2023-10-16 ENCOUNTER — Inpatient Hospital Stay

## 2023-10-16 ENCOUNTER — Other Ambulatory Visit: Payer: Self-pay | Admitting: Genetic Counselor

## 2023-10-16 DIAGNOSIS — Z1379 Encounter for other screening for genetic and chromosomal anomalies: Secondary | ICD-10-CM

## 2023-10-16 DIAGNOSIS — Z853 Personal history of malignant neoplasm of breast: Secondary | ICD-10-CM | POA: Diagnosis present

## 2023-10-16 DIAGNOSIS — Z809 Family history of malignant neoplasm, unspecified: Secondary | ICD-10-CM | POA: Diagnosis not present

## 2023-10-16 DIAGNOSIS — Z807 Family history of other malignant neoplasms of lymphoid, hematopoietic and related tissues: Secondary | ICD-10-CM | POA: Diagnosis not present

## 2023-10-16 DIAGNOSIS — Z800 Family history of malignant neoplasm of digestive organs: Secondary | ICD-10-CM | POA: Insufficient documentation

## 2023-10-16 DIAGNOSIS — Z803 Family history of malignant neoplasm of breast: Secondary | ICD-10-CM | POA: Diagnosis not present

## 2023-10-16 LAB — GENETIC SCREENING ORDER

## 2023-10-16 NOTE — Progress Notes (Signed)
 REFERRING PROVIDER: Gareth Mliss FALCON, FNP [address] [place],  [place] [address]  PRIMARY PROVIDER:  Gareth Mliss FALCON, FNP  PRIMARY REASON FOR VISIT:  Encounter Diagnoses  Name Primary?   History of breast cancer in female Yes   Family history of breast cancer    Family history of colon cancer    I connected with Ms. Nadel on 10/16/2023 at 9:00AM EDT by telephone and verified that I am speaking with the correct person using two identifiers.   Patient location: [place], [place] Provider location: [place] [facility]  HISTORY OF PRESENT ILLNESS:   Ms. Wesch, a 68 y.o. female, was seen for a  cancer genetics consultation at the request of Dr. Gareth due to a personal and family history of cancer.  Ms. Wakeman presents to clinic today to discuss the possibility of a hereditary predisposition to cancer, to discuss genetic testing, and to further clarify her future cancer risks, as well as potential cancer risks for family members.   CANCER HISTORY:  In 2004, at the age of ~68 y.o., Ms. Finamore was diagnosed with breast cancer.  RISK FACTORS:  First live birth at age 30.  OCP use for approximately ~10 years.  Ovaries intact: yes.  Uterus intact: yes.  Menopausal status: postmenopausal.  HRT use: 0 years. Colonoscopy: yes; 2006- normal. Any excessive radiation exposure in the past: no  Past Medical History:  Diagnosis Date   Cancer (HCC)    Depression    GERD (gastroesophageal reflux disease)    OTC prevacid   Personal history of chemotherapy    Personal history of radiation therapy     Past Surgical History:  Procedure Laterality Date   BREAST LUMPECTOMY Right    1980's   BREAST SURGERY     thorecotomy N/A     Social History   Socioeconomic History   Marital status: Married    Spouse name: Not on file   Number of children: Not on file   Years of education: Not on file   Highest education level: Bachelor's degree (e.g., BA, AB, BS)   Occupational History   Not on file  Tobacco Use   Smoking status: Former    Current packs/day: 0.00    Average packs/day: 1 pack/day for 2.0 years (2.0 ttl pk-yrs)    Types: Cigarettes    Start date: 08/01/2010    Quit date: 07/31/2012    Years since quitting: 11.2   Smokeless tobacco: Never   Tobacco comments:    patient states ready to quit  Vaping Use   Vaping status: Never Used  Substance and Sexual Activity   Alcohol use: Yes    Comment: Occassional   Drug use: No   Sexual activity: Yes    Birth control/protection: Post-menopausal  Other Topics Concern   Not on file  Social History Narrative   Not on file   Social Drivers of Health   Financial Resource Strain: Low Risk  (04/18/2023)   Overall Financial Resource Strain (CARDIA)    Difficulty of Paying Living Expenses: Not very hard  Food Insecurity: No Food Insecurity (04/18/2023)   Hunger Vital Sign    Worried About Running Out of Food in the Last Year: Never true    Ran Out of Food in the Last Year: Never true  Transportation Needs: No Transportation Needs (04/18/2023)   PRAPARE - Administrator, Civil Service (Medical): No    Lack of Transportation (Non-Medical): No  Physical Activity: Sufficiently  Active (04/18/2023)   Exercise Vital Sign    Days of Exercise per Week: 5 days    Minutes of Exercise per Session: 40 min  Stress: No Stress Concern Present (04/18/2023)   [employer] of Occupational Health - Occupational Stress Questionnaire    Feeling of Stress : Not at all  Social Connections: Moderately Isolated (04/18/2023)   Social Connection and Isolation Panel    Frequency of Communication with Friends and Family: More than three times a week    Frequency of Social Gatherings with Friends and Family: Three times a week    Attends Religious Services: Never    Active Member of Clubs or Organizations: No    Attends Engineer, structural: Not on file    Marital Status: Married     FAMILY  HISTORY:  We obtained a detailed, 4-generation family history.  Significant diagnoses are listed below: Family History  Problem Relation Age of Onset   Colon cancer Mother 5   Stroke Father        multiple PEs,  ca suspected   Cancer Father        unk. primary- met at dx.   Non-Hodgkin's lymphoma Brother 34 - 58   Cancer Paternal Aunt 29 - 39       possibly breast   Breast cancer Maternal Grandmother    Hypothyroidism Daughter    Healthy Son    Healthy Son       Ms. Vari is unaware of previous family history of genetic testing for hereditary cancer risks. There is no reported Ashkenazi Jewish ancestry.  GENETIC COUNSELING ASSESSMENT: Ms. Saldierna is a 68 y.o. female with a personal and family history of cancer which is somewhat suggestive of a hereditary predisposition to cancer given her young age at diagnosis. We, therefore, discussed and recommended the following at today's visit.   DISCUSSION: We discussed that 5 - 10% of cancer is hereditary, with most cases of breast cancer associated with BRCA1/2.  There are other genes that can be associated with hereditary breast cancer syndromes.  We discussed that testing is beneficial for several reasons including knowing how to follow individuals after completing their treatment and understanding if other family members could be at risk for cancer and allowing them to undergo genetic testing.   We reviewed the characteristics, features and inheritance patterns of hereditary cancer syndromes. We also discussed genetic testing, including the appropriate family members to test, the process of testing, insurance coverage and turn-around-time for results. We discussed the implications of a negative, positive, carrier and/or variant of uncertain significant result. We recommended Ms. Stembridge pursue genetic testing for a panel that includes genes associated with cancer.   Ms. Defeo  was offered a common hereditary cancer panel (40 genes)  and an expanded pan-cancer panel (77 genes). Ms. Picariello was informed of the benefits and limitations of each panel, including that expanded pan-cancer panels contain genes that do not have clear management guidelines at this point in time.  We also discussed that as the number of genes included on a panel increases, the chances of variants of uncertain significance increases. After considering the benefits and limitations of each gene panel, Ms. Dodie elected to have [facility] CancerNext-Expanded Panel.  The CancerNext-Expanded gene panel offered by [facility] and includes sequencing, rearrangement, and RNA analysis for the following 77 genes: AIP, ALK, APC, ATM, AXIN2, BAP1, BARD1, BMPR1A, BRCA1, BRCA2, BRIP1, CDC73, CDH1, CDK4, CDKN1B, CDKN2A, CEBPA, CHEK2, CTNNA1, DDX41, DICER1, ETV6, FH, FLCN, GATA2,  LZTR1, MAX, MBD4, MEN1, MET, MLH1, MSH2, MSH3, MSH6, MUTYH, NF1, NF2, NTHL1, PALB2, PHOX2B, PMS2, POT1, PRKAR1A, PTCH1, PTEN, RAD51C, RAD51D, RB1, RET, RPS20, RUNX1, SDHA, SDHAF2, SDHB, SDHC, SDHD, SMAD4, SMARCA4, SMARCB1, SMARCE1, STK11, SUFU, TMEM127, TP53, TSC1, TSC2, VHL, and WT1 (sequencing and deletion/duplication); EGFR, HOXB13, KIT, MITF, PDGFRA, POLD1, and POLE (sequencing only); EPCAM and GREM1 (deletion/duplication only).   Based on Ms. Mceachern's personal and family history of cancer, she meets medical criteria for genetic testing. Despite that she meets criteria, she may still have an out of pocket cost. We discussed that if her out of pocket cost for testing is over $100, the laboratory should contact them to discuss self-pay prices, patient pay assistance programs, if applicable, and other billing options.  PLAN: After considering the risks, benefits, and limitations, Ms. Dodie provided informed consent to pursue genetic testing and the blood sample was sent to [facility] for analysis of the CancerNext-Expanded Panel. Results should be available within approximately 2-3 weeks'  time, at which point they will be disclosed by telephone to Ms. Dodie, as will any additional recommendations warranted by these results. Ms. Scherman will receive a summary of her genetic counseling visit and a copy of her results once available. This information will also be available in Epic.   Ms. Rigsbee questions were answered to her satisfaction today. Our contact information was provided should additional questions or concerns arise. Thank you for the referral and allowing us  to share in the care of your patient.   Keiona Jenison, MS, [facility] Genetic Counselor [email or web address] (P) [phone]  50 minutes were spent on the date of the encounter in service to the patient including preparation, phone consultation, documentation and care coordination. The patient was seen alone.  Drs. Gudena and/or Lanny were available to discuss this case as needed.   _______________________________________________________________________ For Office Staff:  Number of people involved in session: 1 Was an Intern/ student involved with case: no

## 2023-10-25 ENCOUNTER — Telehealth: Payer: Self-pay | Admitting: Genetic Counselor

## 2023-10-25 ENCOUNTER — Ambulatory Visit: Payer: Self-pay | Admitting: Genetic Counselor

## 2023-10-25 ENCOUNTER — Encounter: Payer: Self-pay | Admitting: Genetic Counselor

## 2023-10-25 DIAGNOSIS — Z1379 Encounter for other screening for genetic and chromosomal anomalies: Secondary | ICD-10-CM | POA: Insufficient documentation

## 2023-10-25 LAB — GENECONNECT MOLECULAR SCREEN: Genetic Analysis Overall Interpretation: NEGATIVE

## 2023-10-25 NOTE — Progress Notes (Signed)
 HPI:   Sandy Hart was previously seen in the [facility] due to a personal and family history of cancer and concerns regarding a hereditary predisposition to cancer. Please refer to our prior cancer genetics clinic note for more information regarding our discussion, assessment and recommendations, at the time. Sandy Hart recent genetic test results were disclosed to her, as were recommendations warranted by these results. These results and recommendations are discussed in more detail below.  CANCER HISTORY:  Oncology History   No history exists.    FAMILY HISTORY:  We obtained a detailed, 4-generation family history.  Significant diagnoses are listed below:      Family History  Problem Relation Age of Onset   Colon cancer Mother 52   Stroke Father          multiple PEs,  ca suspected   Cancer Father          unk. primary- met at dx.   Non-Hodgkin's lymphoma Brother 37 - 14   Cancer Paternal Aunt 28 - 39        possibly breast   Breast cancer Maternal Grandmother     Hypothyroidism Daughter     Healthy Son     Healthy Son               Sandy Hart is unaware of previous family history of genetic testing for hereditary cancer risks. There is no reported Ashkenazi Jewish ancestry.  GENETIC TEST RESULTS:  The [facility] CancerNext-Expanded Panel found no pathogenic mutations.   The CancerNext-Expanded gene panel offered by [facility] and includes sequencing, rearrangement, and RNA analysis for the following 77 genes: AIP, ALK, APC, ATM, AXIN2, BAP1, BARD1, BMPR1A, BRCA1, BRCA2, BRIP1, CDC73, CDH1, CDK4, CDKN1B, CDKN2A, CEBPA, CHEK2, CTNNA1, DDX41, DICER1, ETV6, FH, FLCN, GATA2, LZTR1, MAX, MBD4, MEN1, MET, MLH1, MSH2, MSH3, MSH6, MUTYH, NF1, NF2, NTHL1, PALB2, PHOX2B, PMS2, POT1, PRKAR1A, PTCH1, PTEN, RAD51C, RAD51D, RB1, RET, RPS20, RUNX1, SDHA, SDHAF2, SDHB, SDHC, SDHD, SMAD4, SMARCA4, SMARCB1, SMARCE1, STK11, SUFU, TMEM127, TP53, TSC1, TSC2, VHL, and  WT1 (sequencing and deletion/duplication); EGFR, HOXB13, KIT, MITF, PDGFRA, POLD1, and POLE (sequencing only); EPCAM and GREM1 (deletion/duplication only).   The test report has been scanned into EPIC and is located under the Molecular Pathology section of the Results Review tab.  A portion of the result report is included below for reference. Genetic testing reported out on 10/24/2023.       Even though a pathogenic variant was not identified, possible explanations for the cancer in the family may include: There may be no hereditary risk for cancer in the family. The cancers in Sandy Hart and/or her family may be due to other genetic or environmental factors. There may be a gene mutation in one of these genes that current testing methods cannot detect, but that chance is small. There could be another gene that has not yet been discovered, or that we have not yet tested, that is responsible for the cancer diagnoses in the family.  It is also possible there is a hereditary cause for the cancer in the family that Sandy Hart did not inherit.  Therefore, it is important to remain in touch with cancer genetics in the future so that we can continue to offer Sandy Hart the most up to date genetic testing.   ADDITIONAL GENETIC TESTING:  We discussed with Sandy Hart that her genetic testing was fairly extensive.  If there are genes identified to increase cancer risk that can be  analyzed in the future, we would be happy to discuss and coordinate this testing at that time.    CANCER SCREENING RECOMMENDATIONS:  Sandy Hart test result is considered negative (normal).  This means that we have not identified a hereditary cause for her personal and family history of cancer at this time.   An individual's cancer risk and medical management are not determined by genetic test results alone. Overall cancer risk assessment incorporates additional factors, including personal medical history, family  history, and any available genetic information that may result in a personalized plan for cancer prevention and surveillance. Therefore, it is recommended she continue to follow the cancer management and screening guidelines provided by her oncology and primary healthcare provider.  Based on the reported personal and family history, specific cancer screenings for Sandy Hart and her family include:  Colon Cancer Screening: Due to Sandy Hart's mother's history of colon cancer, she is recommended to repeat colonoscopies every 5 years. More frequent colonoscopies may be recommended if polyps are identified.  RECOMMENDATIONS FOR FAMILY MEMBERS:   Since she did not inherit a mutation in a cancer predisposition gene included on this panel, her children could not have inherited a mutation from her in one of these genes. Individuals in this family might be at some increased risk of developing cancer, over the general population risk, due to the family history of cancer. We recommend women in this family have a yearly mammogram beginning at age 57, or 55 years younger than the earliest onset of cancer, an annual clinical breast exam, and perform monthly breast self-exams.  FOLLOW-UP:  Cancer genetics is a rapidly advancing field and it is possible that new genetic tests will be appropriate for her and/or her family members in the future. We encouraged her to remain in contact with cancer genetics on an annual basis so we can update her personal and family histories and let her know of advances in cancer genetics that may benefit this family.   Our contact number was provided. Sandy Hart questions were answered to her satisfaction, and she knows she is welcome to call us  at anytime with additional questions or concerns.   Thang Flett, MS, [facility] Genetic Counselor [email or web address] (P) [phone]

## 2023-10-25 NOTE — Telephone Encounter (Addendum)
 I contacted [name] to discuss her genetic testing results. No pathogenic variants were identified in the 77 genes analyzed. Detailed clinic note to follow.  The test report has been scanned into EPIC and is located under the Molecular Pathology section of the Results Review tab.  A portion of the result report is included below for reference.   Sandy Ziff, MS, [facility] Genetic Counselor [email or web address] (P) [phone]

## 2023-11-19 ENCOUNTER — Encounter: Payer: Medicare Other | Admitting: Family Medicine

## 2024-03-20 ENCOUNTER — Ambulatory Visit

## 2024-03-20 VITALS — BP 116/68 | Ht 68.0 in | Wt 148.0 lb

## 2024-03-20 DIAGNOSIS — Z0000 Encounter for general adult medical examination without abnormal findings: Secondary | ICD-10-CM | POA: Diagnosis not present

## 2024-03-20 NOTE — Progress Notes (Signed)
 " I connected with  Sandy Hart Finical on 03/20/2024 by a video and audio enabled telemedicine application and verified that I am speaking with the correct person using two identifiers.  Patient Location: Home  Provider Location: Home Office  Persons Participating in Visit: Patient.  I discussed the limitations of evaluation and management by telemedicine. The patient expressed understanding and agreed to proceed.  Vital Signs: Because this visit was a virtual/telehealth visit, some criteria may be missing or patient reported. Any vitals not documented were not able to be obtained and vitals that have been documented are patient reported.  Chief Complaint  Patient presents with   Medicare Wellness     Subjective:   Sandy Hart is a 68 y.o. female who presents for a Medicare Annual Wellness Visit.  Visit info / Clinical Intake: Medicare Wellness Visit Type:: Subsequent Annual Wellness Visit Persons participating in visit and providing information:: patient Medicare Wellness Visit Mode:: Video Since this visit was completed virtually, some vitals may be partially provided or unavailable. Missing vitals are due to the limitations of the virtual format.: Documented vitals are patient reported If Telephone or Video please confirm:: I connected with patient using audio/video enable telemedicine. I verified patient identity with two identifiers, discussed telehealth limitations, and patient agreed to proceed. Patient Location:: home Provider Location:: home office Interpreter Needed?: No Pre-visit prep was completed: yes AWV questionnaire completed by patient prior to visit?: no Living arrangements:: lives with spouse/significant other Patient's Overall Health Status Rating: very good Typical amount of pain: none Does pain affect daily life?: no Are you currently prescribed opioids?: no  Dietary Habits and Nutritional Risks How many meals a day?: 3 Eats fruit and vegetables  daily?: yes Most meals are obtained by: preparing own meals In the last 2 weeks, have you had any of the following?: none Diabetic:: no  Functional Status Activities of Daily Living (to include ambulation/medication): Independent Ambulation: Independent Medication Administration: Independent Home Management (perform basic housework or laundry): Independent Manage your own finances?: yes Primary transportation is: driving Concerns about vision?: no *vision screening is required for WTM* Concerns about hearing?: no  Fall Screening Falls in the past year?: 0 Number of falls in past year: 0 Was there an injury with Fall?: 0 Fall Risk Category Calculator: 0 Patient Fall Risk Level: Low Fall Risk  Fall Risk Patient at Risk for Falls Due to: No Fall Risks Fall risk Follow up: Falls evaluation completed  Home and Transportation Safety: All rugs have non-skid backing?: yes All stairs or steps have railings?: yes Grab bars in the bathtub or shower?: yes Have non-skid surface in bathtub or shower?: yes Good home lighting?: yes Regular seat belt use?: yes Hospital stays in the last year:: no  Cognitive Assessment Difficulty concentrating, remembering, or making decisions? : no Will 6CIT or Mini Cog be Completed: yes What year is it?: 0 points What month is it?: 0 points Give patient an address phrase to remember (5 components): remember words apple , table , penny About what time is it?: 0 points Count backwards from 20 to 1: 0 points Say the months of the year in reverse: 0 points Repeat the address phrase from earlier: 0 points 6 CIT Score: 0 points  Advance Directives (For Healthcare) Does Patient Have a Medical Advance Directive?: Yes Does patient want to make changes to medical advance directive?: No - Patient declined Type of Advance Directive: Healthcare Power of [place]; Living will Copy of Healthcare Power of Attorney in Chart?:  No - copy requested Copy of Living  Will in Chart?: No - copy requested  Reviewed/Updated  Reviewed/Updated: Reviewed All (Medical, Surgical, Family, Medications, Allergies, Care Teams, Patient Goals)    Allergies (verified) Patient has no known allergies.   Current Medications (verified) Outpatient Encounter Medications as of 03/20/2024  Medication Sig   omeprazole (PRILOSEC) 20 MG capsule Take 20 mg by mouth daily. Take one tablet by mouth daily   No facility-administered encounter medications on file as of 03/20/2024.    History: Past Medical History:  Diagnosis Date   Cancer (HCC)    Depression    GERD (gastroesophageal reflux disease)    OTC prevacid   Personal history of chemotherapy    Personal history of radiation therapy    Past Surgical History:  Procedure Laterality Date   BREAST LUMPECTOMY Right    1980's   BREAST SURGERY     thorecotomy N/A    Family History  Problem Relation Age of Onset   Colon cancer Mother 30   Stroke Father        multiple PEs,  ca suspected   Cancer Father        unk. primary- met at dx.   Non-Hodgkin's lymphoma Brother 50 - 98   Hypothyroidism Daughter    Healthy Son    Healthy Son    Cancer Paternal Aunt 30 - 3       possibly breast   Breast cancer Maternal Grandmother    Social History   Occupational History   Not on file  Tobacco Use   Smoking status: Former    Current packs/day: 0.00    Average packs/day: 1 pack/day for 2.0 years (2.0 ttl pk-yrs)    Types: Cigarettes    Start date: 08/01/2010    Quit date: 07/31/2012    Years since quitting: 11.6   Smokeless tobacco: Never   Tobacco comments:    patient states ready to quit  Vaping Use   Vaping status: Never Used  Substance and Sexual Activity   Alcohol use: Yes    Comment: Occassional   Drug use: No   Sexual activity: Yes    Birth control/protection: Post-menopausal   Tobacco Counseling Counseling given: Not Answered Tobacco comments: patient states ready to quit  SDOH Screenings    Food Insecurity: No Food Insecurity (03/20/2024)  Housing: Low Risk (03/20/2024)  Transportation Needs: No Transportation Needs (03/20/2024)  Utilities: Not At Risk (03/20/2024)  Alcohol Screen: Low Risk (04/18/2023)  Depression (PHQ2-9): Low Risk (03/20/2024)  Financial Resource Strain: Low Risk (04/18/2023)  Physical Activity: Sufficiently Active (03/20/2024)  Social Connections: Socially Integrated (03/20/2024)  Stress: No Stress Concern Present (03/20/2024)  Tobacco Use: Medium Risk (03/20/2024)  Health Literacy: Adequate Health Literacy (03/20/2024)   See flowsheets for full screening details  Depression Screen PHQ 2 & 9 Depression Scale- Over the past 2 weeks, how often have you been bothered by any of the following problems? Little interest or pleasure in doing things: 0 Feeling down, depressed, or hopeless (PHQ Adolescent also includes...irritable): 0 PHQ-2 Total Score: 0 Trouble falling or staying asleep, or sleeping too much: 0 Feeling tired or having little energy: 0 Poor appetite or overeating (PHQ Adolescent also includes...weight loss): 0 Feeling bad about yourself - or that you are a failure or have let yourself or your family down: 0 Trouble concentrating on things, such as reading the newspaper or watching television (PHQ Adolescent also includes...like school work): 0 Moving or speaking so slowly that other people could  have noticed. Or the opposite - being so fidgety or restless that you have been moving around a lot more than usual: 0 Thoughts that you would be better off dead, or of hurting yourself in some way: 0 PHQ-9 Total Score: 0 If you checked off any problems, how difficult have these problems made it for you to do your work, take care of things at home, or get along with other people?: Not difficult at all  Depression Treatment Depression Interventions/Treatment : EYV7-0 Score <4 Follow-up Not Indicated     Goals Addressed               This  Visit's Progress     Patient Stated (pt-stated)        Patient would like to stay fit              Objective:    Today's Vitals   03/20/24 0949  BP: 116/68  Weight: 148 lb (67.1 kg)  Height: 5' 8 (1.727 m)   Body mass index is 22.5 kg/m.  Hearing/Vision screen Hearing Screening - Comments:: No difficulties  Vision Screening - Comments:: Patien t wears glasses when driving  Immunizations and Health Maintenance Health Maintenance  Topic Date Due   Zoster Vaccines- Shingrix (1 of 2) Never done   Influenza Vaccine  Never done   Medicare Annual Wellness (AWV)  11/15/2023   COVID-19 Vaccine (3 - 2025-26 season) 12/02/2023   Pneumococcal Vaccine: 50+ Years (1 of 2 - PCV) 04/24/2024 (Originally 03/31/1975)   Mammogram  10/09/2025   Fecal DNA (Cologuard)  11/27/2025   DTaP/Tdap/Td (2 - Td or Tdap) 05/08/2030   Bone Density Scan  Completed   Hepatitis C Screening  Completed   Meningococcal B Vaccine  Aged Out   Colonoscopy  Discontinued        Assessment/Plan:  This is a routine wellness examination for Alexsis.  Patient Care Team: Gareth Mliss FALCON, FNP as PCP - General (Nurse Practitioner)  I have personally reviewed and noted the following in the patients chart:   Medical and social history Use of alcohol, tobacco or illicit drugs  Current medications and supplements including opioid prescriptions. Functional ability and status Nutritional status Physical activity Advanced directives List of other physicians Hospitalizations, surgeries, and ER visits in previous 12 months Vitals Screenings to include cognitive, depression, and falls Referrals and appointments  No orders of the defined types were placed in this encounter.  In addition, I have reviewed and discussed with patient certain preventive protocols, quality metrics, and best practice recommendations. A written personalized care plan for preventive services as well as general preventive health  recommendations were provided to patient.   Lyle MARLA Right, CMA   03/20/2024   No follow-ups on file.  After Visit Summary: (MyChart) Due to this being a telephonic visit, the after visit summary with patients personalized plan was offered to patient via MyChart   No voiced or noted concerns at this time Separate telephone note sent for (rash) , patient declined vaccinations  "

## 2024-03-20 NOTE — Patient Instructions (Signed)
 Sandy Hart,  Thank you for taking the time for your Medicare Wellness Visit. I appreciate your continued commitment to your health goals. Please review the care plan we discussed, and feel free to reach out if I can assist you further.  Please note that Annual Wellness Visits do not include a physical exam. Some assessments may be limited, especially if the visit was conducted virtually. If needed, we may recommend an in-person follow-up with your provider.  Ongoing Care Seeing your primary care provider every 3 to 6 months helps us  monitor your health and provide consistent, personalized care.   Referrals If a referral was made during today's visit and you haven't received any updates within two weeks, please contact the referred provider directly to check on the status.  Recommended Screenings:  Health Maintenance  Topic Date Due   Zoster (Shingles) Vaccine (1 of 2) Never done   Flu Shot  Never done   Medicare Annual Wellness Visit  11/15/2023   COVID-19 Vaccine (3 - 2025-26 season) 12/02/2023   Pneumococcal Vaccine for age over 25 (1 of 2 - PCV) 04/24/2024*   Breast Cancer Screening  10/09/2025   Cologuard (Stool DNA test)  11/27/2025   DTaP/Tdap/Td vaccine (2 - Td or Tdap) 05/08/2030   Osteoporosis screening with Bone Density Scan  Completed   Hepatitis C Screening  Completed   Meningitis B Vaccine  Aged Out   Colon Cancer Screening  Discontinued  *Topic was postponed. The date shown is not the original due date.       03/20/2024    9:52 AM  Advanced Directives  Does Patient Have a Medical Advance Directive? Yes  Type of Estate Agent of [place];Living will  Does patient want to make changes to medical advance directive? No - Patient declined  Copy of Healthcare Power of Attorney in Chart? No - copy requested    Vision: Annual vision screenings are recommended for early detection of glaucoma, cataracts, and diabetic retinopathy. These exams can  also reveal signs of chronic conditions such as diabetes and high blood pressure.  Dental: Annual dental screenings help detect early signs of oral cancer, gum disease, and other conditions linked to overall health, including heart disease and diabetes.  Please see the attached documents for additional preventive care recommendations.
